# Patient Record
Sex: Female | Born: 1965 | Race: White | Hispanic: No | Marital: Married | Smoking: Current every day smoker
Health system: Southern US, Community
[De-identification: ages and names within clinical notes are randomized; demographics above are authoritative.]

## PROBLEM LIST (undated history)

## (undated) DIAGNOSIS — G8929 Other chronic pain: Secondary | ICD-10-CM

## (undated) DIAGNOSIS — Z973 Presence of spectacles and contact lenses: Secondary | ICD-10-CM

## (undated) HISTORY — PX: BREAST SURGERY: SHX581

## (undated) HISTORY — PX: BREAST EXCISIONAL BIOPSY: SUR124

---

## 1997-11-25 ENCOUNTER — Other Ambulatory Visit: Admission: RE | Admit: 1997-11-25 | Discharge: 1997-11-25 | Payer: Self-pay | Admitting: Obstetrics and Gynecology

## 1998-03-18 ENCOUNTER — Emergency Department (HOSPITAL_COMMUNITY): Admission: EM | Admit: 1998-03-18 | Discharge: 1998-03-18 | Payer: Self-pay | Admitting: Emergency Medicine

## 1999-03-19 ENCOUNTER — Other Ambulatory Visit: Admission: RE | Admit: 1999-03-19 | Discharge: 1999-03-19 | Payer: Self-pay | Admitting: Obstetrics and Gynecology

## 2000-03-14 ENCOUNTER — Encounter: Payer: Self-pay | Admitting: Emergency Medicine

## 2000-03-14 ENCOUNTER — Emergency Department (HOSPITAL_COMMUNITY): Admission: EM | Admit: 2000-03-14 | Discharge: 2000-03-14 | Payer: Self-pay | Admitting: Emergency Medicine

## 2000-03-16 ENCOUNTER — Encounter: Admission: RE | Admit: 2000-03-16 | Discharge: 2000-03-16 | Payer: Self-pay | Admitting: Hematology and Oncology

## 2000-03-17 ENCOUNTER — Encounter: Admission: RE | Admit: 2000-03-17 | Discharge: 2000-03-17 | Payer: Self-pay | Admitting: Internal Medicine

## 2000-03-17 ENCOUNTER — Other Ambulatory Visit: Admission: RE | Admit: 2000-03-17 | Discharge: 2000-03-17 | Payer: Self-pay | Admitting: Internal Medicine

## 2000-06-05 ENCOUNTER — Other Ambulatory Visit: Admission: RE | Admit: 2000-06-05 | Discharge: 2000-06-05 | Payer: Self-pay | Admitting: Obstetrics & Gynecology

## 2001-02-05 ENCOUNTER — Inpatient Hospital Stay (HOSPITAL_COMMUNITY): Admission: AD | Admit: 2001-02-05 | Discharge: 2001-02-08 | Payer: Self-pay | Admitting: Obstetrics & Gynecology

## 2001-11-21 ENCOUNTER — Other Ambulatory Visit: Admission: RE | Admit: 2001-11-21 | Discharge: 2001-11-21 | Payer: Self-pay | Admitting: Obstetrics and Gynecology

## 2002-08-08 HISTORY — PX: MINOR FULGERATION OF ANAL CONDYLOMA: SHX6467

## 2003-01-03 ENCOUNTER — Emergency Department (HOSPITAL_COMMUNITY): Admission: EM | Admit: 2003-01-03 | Discharge: 2003-01-03 | Payer: Self-pay | Admitting: Emergency Medicine

## 2003-01-07 ENCOUNTER — Other Ambulatory Visit: Admission: RE | Admit: 2003-01-07 | Discharge: 2003-01-07 | Payer: Self-pay | Admitting: Obstetrics and Gynecology

## 2003-08-09 HISTORY — PX: DILATION AND CURETTAGE OF UTERUS: SHX78

## 2004-01-19 ENCOUNTER — Other Ambulatory Visit: Admission: RE | Admit: 2004-01-19 | Discharge: 2004-01-19 | Payer: Self-pay | Admitting: Obstetrics and Gynecology

## 2004-04-13 ENCOUNTER — Ambulatory Visit (HOSPITAL_COMMUNITY): Admission: RE | Admit: 2004-04-13 | Discharge: 2004-04-13 | Payer: Self-pay | Admitting: Obstetrics and Gynecology

## 2004-05-02 ENCOUNTER — Encounter (INDEPENDENT_AMBULATORY_CARE_PROVIDER_SITE_OTHER): Payer: Self-pay | Admitting: *Deleted

## 2004-05-02 ENCOUNTER — Ambulatory Visit (HOSPITAL_COMMUNITY): Admission: AD | Admit: 2004-05-02 | Discharge: 2004-05-02 | Payer: Self-pay | Admitting: Obstetrics and Gynecology

## 2005-04-07 ENCOUNTER — Other Ambulatory Visit: Admission: RE | Admit: 2005-04-07 | Discharge: 2005-04-07 | Payer: Self-pay | Admitting: Obstetrics and Gynecology

## 2005-04-14 IMAGING — US US OB TRANSVAGINAL
1 series · 18 of 28 positions shown · non-contrast
Comparison: none

CLINICAL DATA: Ten weeks pregnant with spotting.
 OB ULTRASOUND TRANSVERSE 
 An intrauterine fetal pole is identified.  By crown rump length measurements, the estimated gestational age is 7 weeks 6 days; however, no cardiac activity is seen, consistent with fetal demise.  A small subchorionic hemorrhage is noted.  Corpus luteum cyst is noted on the right measuring 2.2 cm.  The left ovary is normal.  No free fluid is seen. 
 IMPRESSION
 Intrauterine fetal pole of 7 weeks 6 days gestation.  However, no cardiac activity is seen, consistent with fetal demise.

[Series 1: us ob comp<14 wk · 34 acquisitions, 18 frames shown]
[im 1/34]
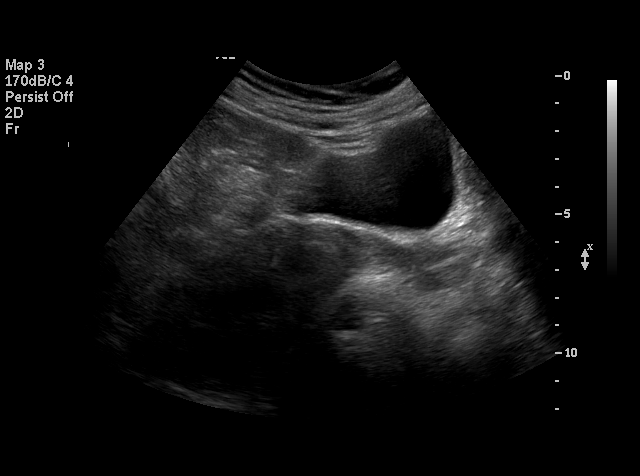
[im 3/34]
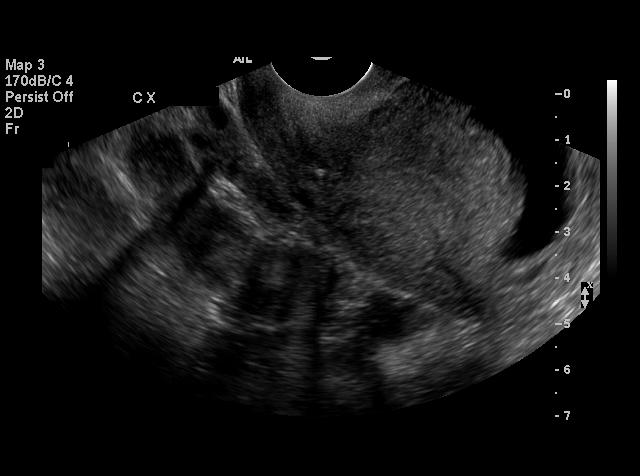
[im 4/34]
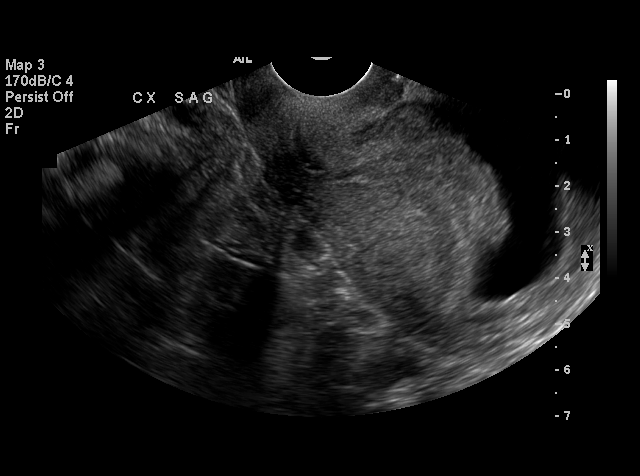
[im 7/34]
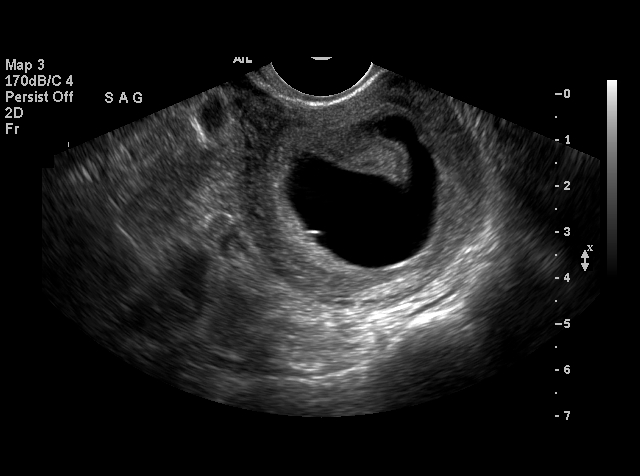
[im 9/34]
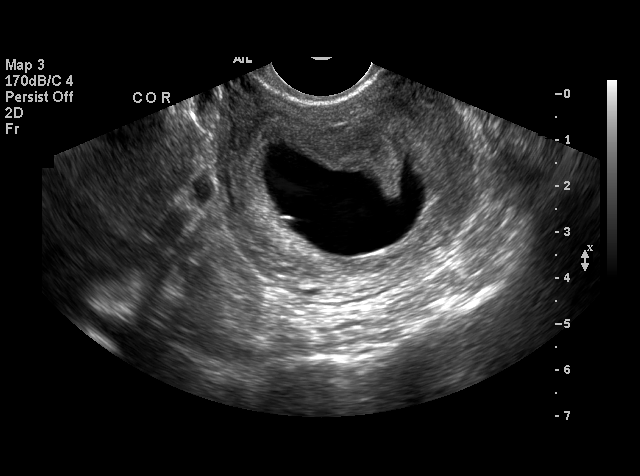
[im 10/34]
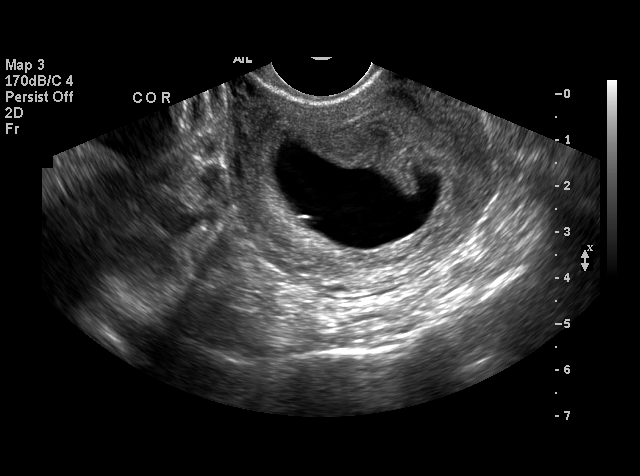
[im 13/34]
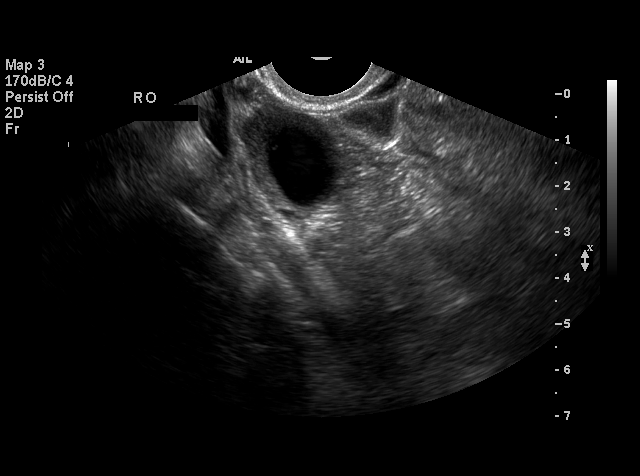
[im 14/34]
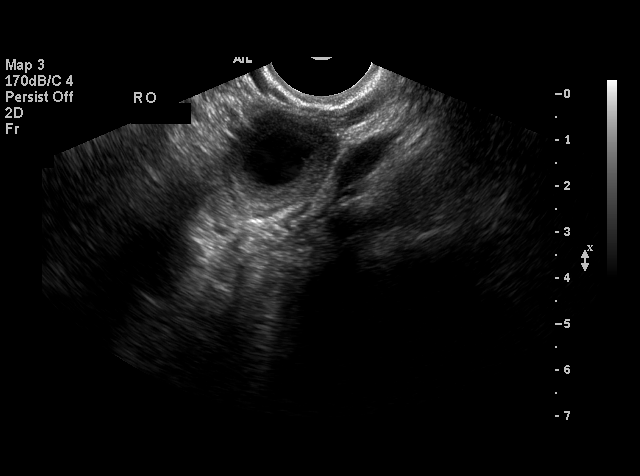
[im 16/34]
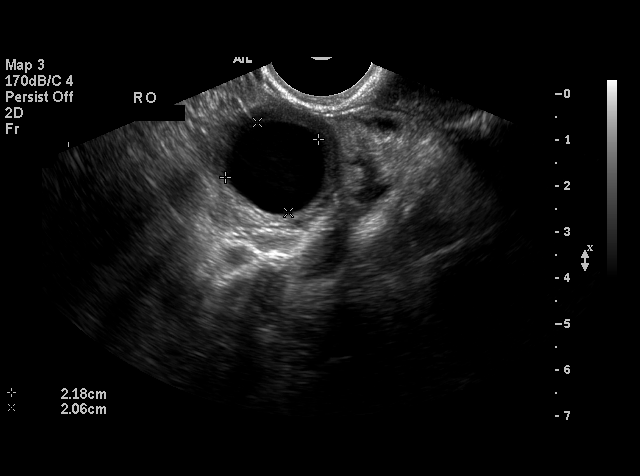
[im 18/34]
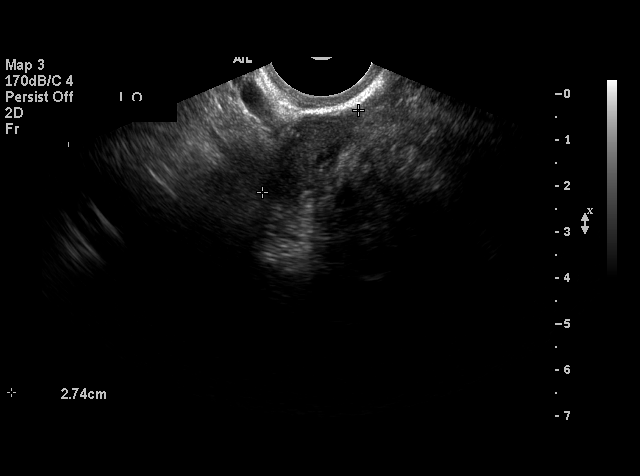
[im 20/34]
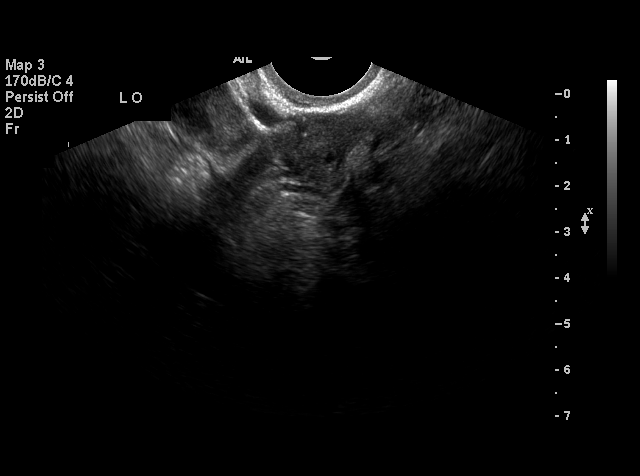
[im 21/34]
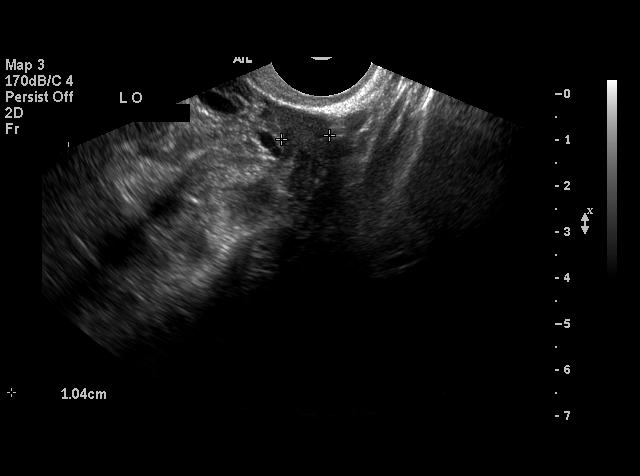
[im 24/34]
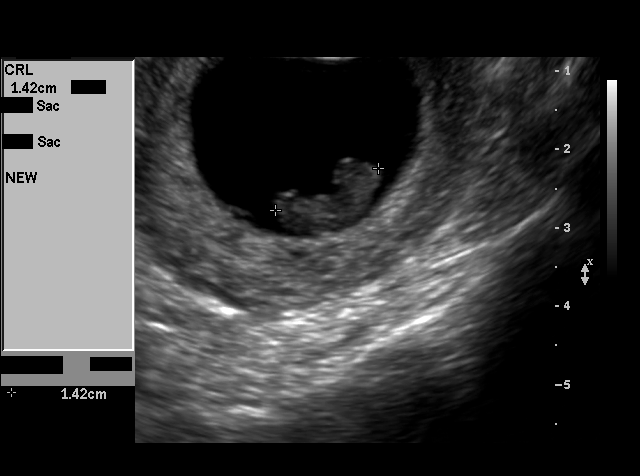
[im 26/34]
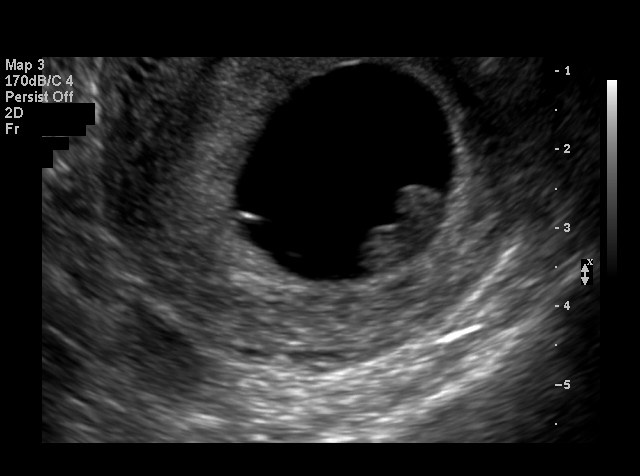
[im 27/34]
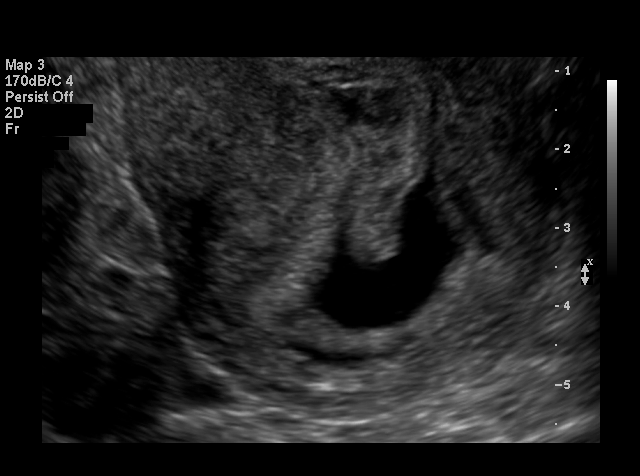
[im 30/34]
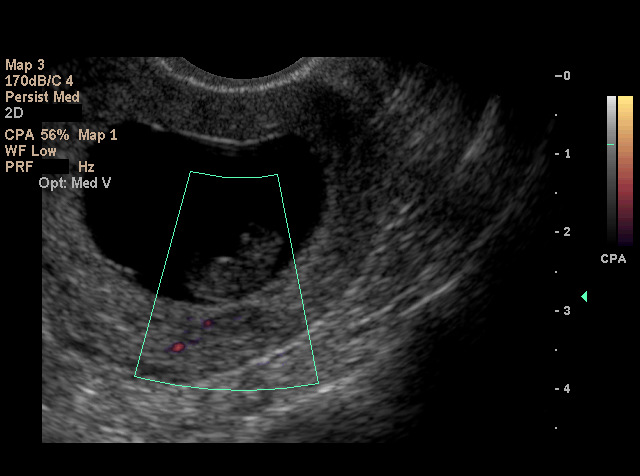
[im 31/34]
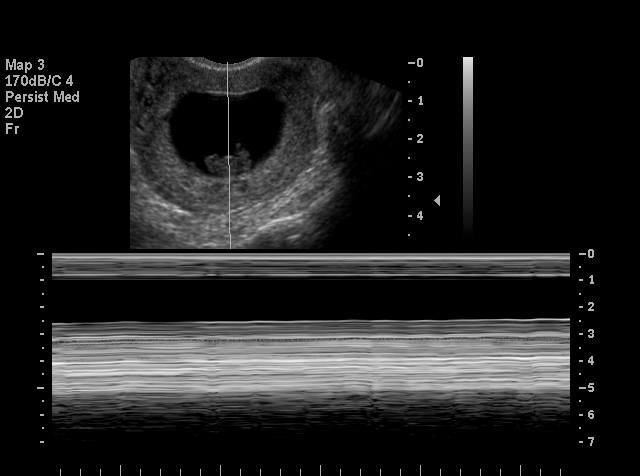
[im 34/34]
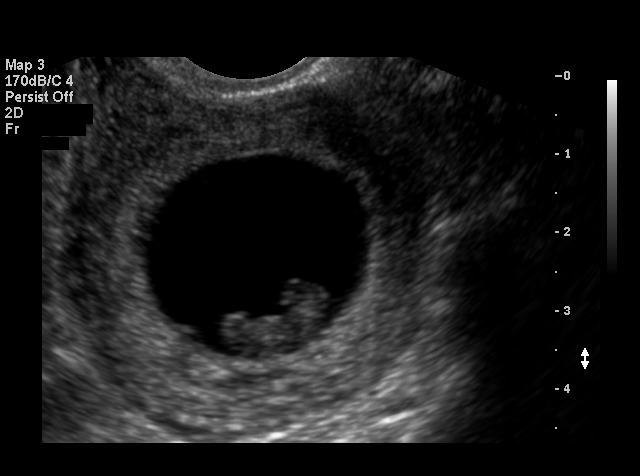

[18 of 28 positions shown; findings below may reference images not displayed]

## 2007-01-12 ENCOUNTER — Encounter: Admission: RE | Admit: 2007-01-12 | Discharge: 2007-01-12 | Payer: Self-pay | Admitting: Obstetrics and Gynecology

## 2007-08-09 HISTORY — PX: CERVICAL FUSION: SHX112

## 2008-04-26 ENCOUNTER — Emergency Department (HOSPITAL_COMMUNITY): Admission: EM | Admit: 2008-04-26 | Discharge: 2008-04-27 | Payer: Self-pay | Admitting: Emergency Medicine

## 2008-06-06 ENCOUNTER — Ambulatory Visit (HOSPITAL_COMMUNITY): Admission: RE | Admit: 2008-06-06 | Discharge: 2008-06-07 | Payer: Self-pay | Admitting: Neurosurgery

## 2008-08-07 ENCOUNTER — Encounter: Admission: RE | Admit: 2008-08-07 | Discharge: 2008-08-07 | Payer: Self-pay | Admitting: Neurosurgery

## 2009-02-16 ENCOUNTER — Encounter: Admission: RE | Admit: 2009-02-16 | Discharge: 2009-02-16 | Payer: Self-pay | Admitting: Family Medicine

## 2009-09-15 ENCOUNTER — Encounter: Admission: RE | Admit: 2009-09-15 | Discharge: 2009-09-15 | Payer: Self-pay | Admitting: Neurosurgery

## 2009-12-14 ENCOUNTER — Encounter: Admission: RE | Admit: 2009-12-14 | Discharge: 2009-12-14 | Payer: Self-pay | Admitting: Neurosurgery
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
## 2010-12-21 NOTE — Op Note (Signed)
NAMEARDELLE, HALIBURTON             ACCOUNT NO.:  [id number]   MEDICAL RECORD NO.:  [id number]          PATIENT TYPE:  OIB   LOCATION:  3031                         FACILITY:  [facility]   PHYSICIAN:  Payton Doughty, M.D.      DATE OF BIRTH:  16-Oct-1965   DATE OF PROCEDURE:  06/06/2008  DATE OF DISCHARGE:                               OPERATIVE REPORT   PREOPERATIVE DIAGNOSIS:  Herniated disk at C5-C6.   POSTOPERATIVE DIAGNOSIS:  Herniated disk at C5-C6.   PROCEDURE:  C5-C6 anterior cervical decompression and fusion with reflex  hybrid plate.   DICTATING DOCTOR:  Payton Doughty, MD, Neurosurgery.   ANESTHESIA:  General endotracheal.   PREPARATION:  Prepped and draped with alcohol wipe.   COMPLICATIONS:  None.   NURSE ASSISTANT:  [place].   DOCTOR ASSISTANT:  Stefani Dama, MD.   BODY OF TEXT:  A 45 year old girl with herniated disk at C5-C6, was  taken to the operative room, smoothly anesthetized and intubated, placed  spine on the operating table, neck extended in the halter head traction.  Following shave, prep, and drape in the usual sterile fashion, skin was  incised in the medial border of the sternocleidomastoid muscle from the  midline at the level of carotid tubercle.  The platysma was identified,  elevated, divided, and undermined.  Sternocleidomastoid was identified.  Medial dissection revealed the carotid artery was retracted laterally  and the left trachea and esophagus were retracted laterally to the right  exposing the bones in the anterior cervical spine.  A marker was placed.  Intraoperative x-ray was obtained to confirm correctness of the level  and after confirmed the correctness of the level, diskectomy was carried  out at C5-C6 under gross observation.  The operating microscope was then  brought in.  We used microdissection technique to remove the remaining  disks and divided the posterior longitudinal ligament and explored the  anterior epidural space and  neural foramen.  Following complete  decompression, the wound was irrigated and hemostasis was assured.  An 8-  mm bone graft was fashioned with patellar allograft and tapped into  place.  A 60-mm Reflex hybrid plate was then used with 12-mm screws 2 in  C5 and 2 in C6.  Intraoperative x-ray showed good placement of bone  graft, plate, and screws.  Successive layers of 3-0  Vicryl and 4-0 Vicryl were used to close.  Benzoin and Steri-Strips were  placed, made occlusive with Telfa OpSite.  The patient returned to the  recovery room in good condition.  Prior to closure, the esophagus was  carefully inspected and found to be free lesions.           ______________________________  Payton Doughty, M.D.     MWR/MEDQ  D:  06/06/2008  T:  06/07/2008  Job:  [id number]

## 2010-12-24 NOTE — Op Note (Signed)
Savannah Sparks, Savannah Sparks             ACCOUNT NO.:  [id number]   MEDICAL RECORD NO.:  [id number]          PATIENT TYPE:  AMB   LOCATION:  SDC                           FACILITY:  [facility]   PHYSICIAN:  Malva Limes, M.D.    DATE OF BIRTH:  11/14/1965   DATE OF PROCEDURE:  05/02/2004  DATE OF DISCHARGE:                                 OPERATIVE REPORT   PREOPERATIVE DIAGNOSES:  Spontaneous abortion.   POSTOPERATIVE DIAGNOSES:  Spontaneous abortion.   PROCEDURE:  Dilation and curettage.   SURGEON:  Malva Limes, M.D.   ANESTHESIA:  Spinal.   ANTIBIOTICS:  Ancef 1 g.   DRAINS:  Red rubber catheter bladder.   ESTIMATED BLOOD LOSS:  20 mL.   COMPLICATIONS:  None.   SPECIMENS:  Products of conception sent to pathology.   DESCRIPTION OF PROCEDURE:  The patient was taken to the operating room where  a pelvic exam revealed the patient had a retroverted 10 week size uterus.  She was then prepped with Hibiclens and her bladder was drained with a red  rubber catheter. She was draped in the usual fashion for this procedure.  A  sterile speculum was placed in the vagina. A single tooth tenaculum was  applied at the anterior cervical lip. The patient was then serially dilated  to a 31 [place] and 9 mm suction cannula was placed into the uterine cavity  and products of conception withdrawn.  Sharp curettage was then performed  followed by repeat suction.  The patient was taken to the recovery room in  stable condition. Instrument and lap counts were correct x1. The patient  will be discharged to home. She will be given RhoGAM if her blood type is Rh  negative. The patient will be sent home with Keflex 500 mg q.i.d. for two  days and Anaprox double strength p.r.n.  She will followup in the office in  four weeks.     Mark   MA/MEDQ  D:  05/02/2004  T:  05/03/2004  Job:  [id number]

## 2011-05-09 LAB — COMPREHENSIVE METABOLIC PANEL
AST: 15
Albumin: 4
Alkaline Phosphatase: 79
Chloride: 104
Creatinine, Ser: 0.66
GFR calc non Af Amer: >60
Sodium: 137
Total Bilirubin: 0.6

## 2011-05-09 LAB — POCT I-STAT, CHEM 8
BUN: 11
Hemoglobin: 15
Potassium: 3.6
Sodium: 139

## 2011-05-09 LAB — DIFFERENTIAL
Basophils Absolute: 0.1
Basophils Relative: 1
Eosinophils Absolute: 0.2
Lymphocytes Relative: 38
Monocytes Absolute: 0.6
Monocytes Relative: 5
Neutro Abs: 6.6

## 2011-05-09 LAB — URINALYSIS, ROUTINE W REFLEX MICROSCOPIC
Bilirubin Urine: NEGATIVE
Glucose, UA: NEGATIVE
Hgb urine dipstick: NEGATIVE
Nitrite: NEGATIVE
Protein, ur: NEGATIVE
Specific Gravity, Urine: 1.013
Urobilinogen, UA: 0.2

## 2011-05-09 LAB — PROTIME-INR
INR: 0.9
Prothrombin Time: 12.5

## 2011-05-09 LAB — APTT: aPTT: 29

## 2011-05-09 LAB — CBC
MCV: 95.5
Platelets: 507 — ABNORMAL HIGH

## 2012-11-05 ENCOUNTER — Other Ambulatory Visit: Payer: Self-pay | Admitting: Chiropractic Medicine

## 2012-11-05 ENCOUNTER — Ambulatory Visit
Admission: RE | Admit: 2012-11-05 | Discharge: 2012-11-05 | Disposition: A | Discharge status: Home / Self Care | Payer: 59 | Source: Ambulatory Visit | Attending: Chiropractic Medicine | Admitting: Chiropractic Medicine

## 2012-11-05 DIAGNOSIS — M25511 Pain in right shoulder: Secondary | ICD-10-CM

## 2013-05-17 ENCOUNTER — Other Ambulatory Visit: Payer: Self-pay

## 2013-05-17 DIAGNOSIS — Z1231 Encounter for screening mammogram for malignant neoplasm of breast: Secondary | ICD-10-CM

## 2013-05-24 ENCOUNTER — Other Ambulatory Visit: Payer: Self-pay | Admitting: Family Medicine

## 2013-05-24 ENCOUNTER — Ambulatory Visit
Admission: RE | Admit: 2013-05-24 | Discharge: 2013-05-24 | Disposition: A | Discharge status: Home / Self Care | Payer: 59 | Source: Ambulatory Visit

## 2013-05-24 DIAGNOSIS — E041 Nontoxic single thyroid nodule: Secondary | ICD-10-CM

## 2013-05-24 DIAGNOSIS — Z1231 Encounter for screening mammogram for malignant neoplasm of breast: Secondary | ICD-10-CM

## 2013-05-27 ENCOUNTER — Ambulatory Visit
Admission: RE | Admit: 2013-05-27 | Discharge: 2013-05-27 | Disposition: A | Discharge status: Home / Self Care | Payer: 59 | Source: Ambulatory Visit | Attending: Family Medicine | Admitting: Family Medicine

## 2013-05-27 ENCOUNTER — Other Ambulatory Visit: Payer: 59

## 2013-05-27 DIAGNOSIS — E041 Nontoxic single thyroid nodule: Secondary | ICD-10-CM

## 2013-05-28 ENCOUNTER — Other Ambulatory Visit: Payer: Self-pay | Admitting: Obstetrics and Gynecology

## 2013-05-28 ENCOUNTER — Ambulatory Visit
Admission: RE | Admit: 2013-05-28 | Discharge: 2013-05-28 | Disposition: A | Discharge status: Home / Self Care | Payer: 59 | Source: Ambulatory Visit | Attending: Obstetrics and Gynecology | Admitting: Obstetrics and Gynecology

## 2013-05-28 DIAGNOSIS — R928 Other abnormal and inconclusive findings on diagnostic imaging of breast: Secondary | ICD-10-CM

## 2013-05-31 ENCOUNTER — Ambulatory Visit
Admission: RE | Admit: 2013-05-31 | Discharge: 2013-05-31 | Disposition: A | Discharge status: Home / Self Care | Payer: 59 | Source: Ambulatory Visit | Attending: Obstetrics and Gynecology | Admitting: Obstetrics and Gynecology

## 2013-05-31 ENCOUNTER — Other Ambulatory Visit: Payer: Self-pay | Admitting: Diagnostic Radiology

## 2013-05-31 DIAGNOSIS — R928 Other abnormal and inconclusive findings on diagnostic imaging of breast: Secondary | ICD-10-CM

## 2013-06-04 ENCOUNTER — Other Ambulatory Visit: Payer: Self-pay | Admitting: Obstetrics and Gynecology

## 2013-06-10 ENCOUNTER — Encounter (INDEPENDENT_AMBULATORY_CARE_PROVIDER_SITE_OTHER): Payer: Self-pay

## 2013-06-10 ENCOUNTER — Other Ambulatory Visit (INDEPENDENT_AMBULATORY_CARE_PROVIDER_SITE_OTHER): Payer: Self-pay | Admitting: *Deleted

## 2013-06-10 ENCOUNTER — Encounter (INDEPENDENT_AMBULATORY_CARE_PROVIDER_SITE_OTHER): Payer: Self-pay | Admitting: General Surgery

## 2013-06-10 ENCOUNTER — Ambulatory Visit (INDEPENDENT_AMBULATORY_CARE_PROVIDER_SITE_OTHER): Payer: 59 | Admitting: General Surgery

## 2013-06-10 VITALS — BP 130/80 | HR 84 | Temp 98.6°F | Resp 15 | Ht 62.0 in | Wt 160.2 lb

## 2013-06-10 DIAGNOSIS — R921 Mammographic calcification found on diagnostic imaging of breast: Secondary | ICD-10-CM

## 2013-06-10 DIAGNOSIS — R928 Other abnormal and inconclusive findings on diagnostic imaging of breast: Secondary | ICD-10-CM

## 2013-06-10 DIAGNOSIS — A630 Anogenital (venereal) warts: Secondary | ICD-10-CM

## 2013-06-10 NOTE — Progress Notes (Signed)
Patient ID: Savannah Sparks, female   DOB: 04-02-1966, 47 y.o.   MRN: [id number]  Chief Complaint  Patient presents with  . New Evaluation    eval LT br atypia    HPI Savannah Sparks is a 47 y.o. female.  referred by Dr Rosezella Rumpf This is a 47 year old female with a history of a benign core biopsy of the left breast as well as a family history in 2 presence of breast cancer. She underwent her regular routine screening mammogram this year with a finding of some left breast calcifications. This underwent a core biopsy with the results showing flat epithelial atypia with calcification. She was then referred for evaluation for possible excision of this area. She has no breast symptoms related to this.  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 Plan was to send her over to see one of [place] an evaluation for this. I told her that I can see her for that today. These areas have increased and become more symptomatic. She has had these treated with the laser a number of years ago. They  mostly are causing her some moisture as well as some itching right now. It looks like some of these have undergone cryotherapy in his office.  History reviewed. No pertinent past medical history.  Past Surgical History  Procedure Laterality Date  . Back surgery      C4 C5 FUSION    History reviewed. No pertinent family history.  Social History History  Substance Use Topics  . Smoking status: Current Every Day Smoker -- 1.50 packs/day  . Smokeless tobacco: Never Used  . Alcohol Use: Yes    No Known Allergies  Current Outpatient Prescriptions  Medication Sig Dispense Refill  . ibuprofen (ADVIL,MOTRIN) 800 MG tablet       . oxymorphone (OPANA) 10 MG tablet        No current facility-administered medications for this visit.    Review of Systems Review of Systems  Constitutional: Negative for fever, chills and unexpected weight change.  HENT: Negative for congestion, hearing loss,  sore throat, trouble swallowing and voice change.   Eyes: Negative for visual disturbance.  Respiratory: Negative for cough and wheezing.   Cardiovascular: Negative for chest pain, palpitations and leg swelling.  Gastrointestinal: Negative for nausea, vomiting, abdominal pain, diarrhea, constipation, blood in stool, abdominal distention and anal bleeding.  Genitourinary: Negative for hematuria, vaginal bleeding and difficulty urinating.  Musculoskeletal: Negative for arthralgias.  Skin: Negative for rash and wound.  Neurological: Negative for seizures, syncope and headaches.  Hematological: Negative for adenopathy. Does not bruise/bleed easily.  Psychiatric/Behavioral: Negative for confusion.    Blood pressure 130/80, pulse 84, temperature 98.6 F (37 C), temperature source Temporal, resp. rate 15, height 5\' 2"  (1.575 m), weight 160 lb 3.2 oz (72.666 kg).  Physical Exam Physical Exam  Vitals reviewed. Constitutional: She appears well-developed and well-nourished.  Neck: Neck supple.  Cardiovascular: Normal rate, regular rhythm and normal heart sounds.   Pulmonary/Chest: Effort normal and breath sounds normal. She has no wheezes. She has no rales. Right breast exhibits no inverted nipple, no mass, no nipple discharge, no skin change and no tenderness. Left breast exhibits no inverted nipple, no mass, no nipple discharge, no skin change and no tenderness.  Genitourinary: Rectal exam shows internal hemorrhoid (small). Rectal exam shows no mass and no tenderness.     Lymphadenopathy:    She has no cervical adenopathy.    She has no axillary adenopathy.  Right: No supraclavicular adenopathy present.       Left: No supraclavicular adenopathy present.    Data Reviewed Notes from Dr Dareen Piano  Assessment    1. Left breast core biopsy with FEA 2. Genital condyloma     Plan    1. We discussed a left breast wire-guided excisional biopsy for the diagnosis of flat epithelial  atypia. There do appear to be some residual calcifications and areas a possibility that there could be carcinoma associated with this and I quoted her a risk of about 5-15%. We discussed the performance of a wire-guided excision and the indications for that. We discussed the risks and benefits associated with that.  2. 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 I did an endoscopy today and I did not CVs in the rectum. I did discuss fulgurating these condyloma at the same time that we do her breast excisional biopsy. We discussed that these would come back at some point due to the nature of the disease. I will also plan doing doing a proctoscopy at the same time to assure there is no rectal disease.        Waymond Meador 06/10/2013, 11:14 AM

## 2013-06-14 ENCOUNTER — Ambulatory Visit (INDEPENDENT_AMBULATORY_CARE_PROVIDER_SITE_OTHER): Payer: 59 | Admitting: General Surgery

## 2013-07-08 ENCOUNTER — Encounter (HOSPITAL_BASED_OUTPATIENT_CLINIC_OR_DEPARTMENT_OTHER): Payer: Self-pay | Admitting: *Deleted

## 2013-07-08 NOTE — Progress Notes (Signed)
To come in for [facility] labs- 

## 2013-07-09 ENCOUNTER — Encounter (HOSPITAL_BASED_OUTPATIENT_CLINIC_OR_DEPARTMENT_OTHER)
Admission: RE | Admit: 2013-07-09 | Discharge: 2013-07-09 | Disposition: A | Discharge status: Home / Self Care | Payer: 59 | Source: Ambulatory Visit | Attending: General Surgery | Admitting: General Surgery

## 2013-07-09 LAB — CBC WITH DIFFERENTIAL/PLATELET
Basophils Absolute: 0.1 10*3/uL (ref 0.0–0.1)
Basophils Relative: 1 % (ref 0–1)
Eosinophils Absolute: 0.2 10*3/uL (ref 0.0–0.7)
Eosinophils Relative: 2 % (ref 0–5)
HCT: 42.8 % (ref 36.0–46.0)
Hemoglobin: 14.7 g/dL (ref 12.0–15.0)
Lymphocytes Relative: 45 % (ref 12–46)
MCH: 33.3 pg (ref 26.0–34.0)
MCHC: 34.3 g/dL (ref 30.0–36.0)
MCV: 97.1 fL (ref 78.0–100.0)
Monocytes Absolute: 0.7 10*3/uL (ref 0.1–1.0)
Monocytes Relative: 6 % (ref 3–12)
Platelets: 525 10*3/uL — ABNORMAL HIGH (ref 150–400)
RDW: 13.5 % (ref 11.5–15.5)
WBC: 12.8 10*3/uL — ABNORMAL HIGH (ref 4.0–10.5)

## 2013-07-09 LAB — BASIC METABOLIC PANEL
BUN: 12 mg/dL (ref 6–23)
CO2: 26 mEq/L (ref 19–32)
Calcium: 9.5 mg/dL (ref 8.4–10.5)
Chloride: 101 mEq/L (ref 96–112)
Creatinine, Ser: 0.64 mg/dL (ref 0.50–1.10)
Glucose, Bld: 94 mg/dL (ref 70–99)

## 2013-07-11 ENCOUNTER — Encounter (HOSPITAL_BASED_OUTPATIENT_CLINIC_OR_DEPARTMENT_OTHER)
Admission: RE | Disposition: A | Discharge status: Home / Self Care | Payer: Self-pay | Source: Ambulatory Visit | Attending: General Surgery

## 2013-07-11 ENCOUNTER — Ambulatory Visit (HOSPITAL_BASED_OUTPATIENT_CLINIC_OR_DEPARTMENT_OTHER): Payer: 59 | Admitting: Anesthesiology

## 2013-07-11 ENCOUNTER — Encounter (HOSPITAL_BASED_OUTPATIENT_CLINIC_OR_DEPARTMENT_OTHER): Payer: Self-pay | Admitting: *Deleted

## 2013-07-11 ENCOUNTER — Encounter (HOSPITAL_BASED_OUTPATIENT_CLINIC_OR_DEPARTMENT_OTHER): Payer: 59 | Admitting: Anesthesiology

## 2013-07-11 ENCOUNTER — Ambulatory Visit (HOSPITAL_BASED_OUTPATIENT_CLINIC_OR_DEPARTMENT_OTHER)
Admission: RE | Admit: 2013-07-11 | Discharge: 2013-07-11 | Disposition: A | Discharge status: Home / Self Care | Payer: 59 | Source: Ambulatory Visit | Attending: General Surgery | Admitting: General Surgery

## 2013-07-11 ENCOUNTER — Ambulatory Visit
Admission: RE | Admit: 2013-07-11 | Discharge: 2013-07-11 | Disposition: A | Discharge status: Home / Self Care | Payer: 59 | Source: Ambulatory Visit | Attending: General Surgery | Admitting: General Surgery

## 2013-07-11 DIAGNOSIS — N6019 Diffuse cystic mastopathy of unspecified breast: Secondary | ICD-10-CM | POA: Insufficient documentation

## 2013-07-11 DIAGNOSIS — R921 Mammographic calcification found on diagnostic imaging of breast: Secondary | ICD-10-CM

## 2013-07-11 DIAGNOSIS — R920 Mammographic microcalcification found on diagnostic imaging of breast: Secondary | ICD-10-CM

## 2013-07-11 DIAGNOSIS — Z01812 Encounter for preprocedural laboratory examination: Secondary | ICD-10-CM | POA: Insufficient documentation

## 2013-07-11 DIAGNOSIS — F17200 Nicotine dependence, unspecified, uncomplicated: Secondary | ICD-10-CM | POA: Insufficient documentation

## 2013-07-11 DIAGNOSIS — Z803 Family history of malignant neoplasm of breast: Secondary | ICD-10-CM | POA: Insufficient documentation

## 2013-07-11 DIAGNOSIS — A630 Anogenital (venereal) warts: Secondary | ICD-10-CM | POA: Insufficient documentation

## 2013-07-11 DIAGNOSIS — B079 Viral wart, unspecified: Secondary | ICD-10-CM | POA: Insufficient documentation

## 2013-07-11 HISTORY — DX: Other chronic pain: G89.29

## 2013-07-11 HISTORY — DX: Presence of spectacles and contact lenses: Z97.3

## 2013-07-11 HISTORY — PX: BREAST BIOPSY: SHX20

## 2013-07-11 HISTORY — PX: WART FULGURATION: SHX5245

## 2013-07-11 SURGERY — BREAST BIOPSY WITH NEEDLE LOCALIZATION
Anesthesia: General | Site: Breast

## 2013-07-11 MED ORDER — HYDROMORPHONE HCL PF 1 MG/ML IJ SOLN
0.2500 mg | INTRAMUSCULAR | Status: DC | PRN
  Administered 2013-07-11 (×2): 0.5 mg via INTRAVENOUS

## 2013-07-11 MED ORDER — FENTANYL CITRATE 0.05 MG/ML IJ SOLN: 50.0000 ug | INTRAMUSCULAR | Status: DC | PRN

## 2013-07-11 MED ORDER — OXYCODONE-ACETAMINOPHEN 10-325 MG PO TABS: 1.0000 | ORAL_TABLET | Freq: Four times a day (QID) | ORAL | Status: DC | PRN

## 2013-07-11 MED ORDER — DIBUCAINE 1 % RE OINT
TOPICAL_OINTMENT | RECTAL | Status: AC
  Filled 2013-07-11: qty 28

## 2013-07-11 MED ORDER — MIDAZOLAM HCL 2 MG/2ML IJ SOLN
INTRAMUSCULAR | Status: AC
  Filled 2013-07-11: qty 2

## 2013-07-11 MED ORDER — BUPIVACAINE HCL (PF) 0.25 % IJ SOLN
INTRAMUSCULAR | Status: AC
  Filled 2013-07-11: qty 60

## 2013-07-11 MED ORDER — MIDAZOLAM HCL 2 MG/2ML IJ SOLN: 1.0000 mg | INTRAMUSCULAR | Status: DC | PRN

## 2013-07-11 MED ORDER — OXYCODONE HCL 5 MG/5ML PO SOLN: 5.0000 mg | Freq: Once | ORAL | Status: AC | PRN

## 2013-07-11 MED ORDER — LACTATED RINGERS IV SOLN
INTRAVENOUS | Status: DC
  Administered 2013-07-11 (×2): via INTRAVENOUS

## 2013-07-11 MED ORDER — PROPOFOL 10 MG/ML IV BOLUS
INTRAVENOUS | Status: AC
  Filled 2013-07-11: qty 20

## 2013-07-11 MED ORDER — CEFAZOLIN SODIUM-DEXTROSE 2-3 GM-% IV SOLR
2.0000 g | INTRAVENOUS | Status: AC
  Administered 2013-07-11: 2 g via INTRAVENOUS

## 2013-07-11 MED ORDER — OXYCODONE HCL 5 MG PO TABS
ORAL_TABLET | ORAL | Status: AC
  Filled 2013-07-11: qty 1

## 2013-07-11 MED ORDER — HYDROMORPHONE HCL PF 1 MG/ML IJ SOLN
INTRAMUSCULAR | Status: AC
  Filled 2013-07-11: qty 1

## 2013-07-11 MED ORDER — PROPOFOL 10 MG/ML IV BOLUS
INTRAVENOUS | Status: DC | PRN
  Administered 2013-07-11: 200 mg via INTRAVENOUS

## 2013-07-11 MED ORDER — OXYCODONE HCL 5 MG PO TABS
5.0000 mg | ORAL_TABLET | Freq: Once | ORAL | Status: AC | PRN
  Administered 2013-07-11: 5 mg via ORAL

## 2013-07-11 MED ORDER — FENTANYL CITRATE 0.05 MG/ML IJ SOLN
INTRAMUSCULAR | Status: DC | PRN
  Administered 2013-07-11 (×2): 50 ug via INTRAVENOUS
  Administered 2013-07-11: 100 ug via INTRAVENOUS
  Administered 2013-07-11 (×2): 50 ug via INTRAVENOUS

## 2013-07-11 MED ORDER — ONDANSETRON HCL 4 MG/2ML IJ SOLN
INTRAMUSCULAR | Status: DC | PRN
  Administered 2013-07-11: 4 mg via INTRAVENOUS

## 2013-07-11 MED ORDER — CEFAZOLIN SODIUM 1-5 GM-% IV SOLN
INTRAVENOUS | Status: AC
  Filled 2013-07-11: qty 100

## 2013-07-11 MED ORDER — MEPERIDINE HCL 25 MG/ML IJ SOLN: 6.2500 mg | INTRAMUSCULAR | Status: DC | PRN

## 2013-07-11 MED ORDER — LIDOCAINE HCL (PF) 1 % IJ SOLN
INTRAMUSCULAR | Status: AC
  Filled 2013-07-11: qty 30

## 2013-07-11 MED ORDER — DEXAMETHASONE SODIUM PHOSPHATE 4 MG/ML IJ SOLN
INTRAMUSCULAR | Status: DC | PRN
  Administered 2013-07-11: 10 mg via INTRAVENOUS

## 2013-07-11 MED ORDER — ONDANSETRON HCL 4 MG/2ML IJ SOLN: 4.0000 mg | Freq: Once | INTRAMUSCULAR | Status: DC | PRN

## 2013-07-11 MED ORDER — MIDAZOLAM HCL 5 MG/5ML IJ SOLN
INTRAMUSCULAR | Status: DC | PRN
  Administered 2013-07-11: 2 mg via INTRAVENOUS

## 2013-07-11 MED ORDER — BUPIVACAINE HCL (PF) 0.25 % IJ SOLN
INTRAMUSCULAR | Status: DC | PRN
  Administered 2013-07-11: 19 mL

## 2013-07-11 MED ORDER — LIDOCAINE HCL (CARDIAC) 20 MG/ML IV SOLN
INTRAVENOUS | Status: DC | PRN
  Administered 2013-07-11: 100 mg via INTRAVENOUS

## 2013-07-11 MED ORDER — KETOROLAC TROMETHAMINE 30 MG/ML IJ SOLN
INTRAMUSCULAR | Status: DC | PRN
  Administered 2013-07-11: 30 mg via INTRAVENOUS

## 2013-07-11 MED ORDER — FENTANYL CITRATE 0.05 MG/ML IJ SOLN
INTRAMUSCULAR | Status: AC
  Filled 2013-07-11: qty 6

## 2013-07-11 SURGICAL SUPPLY — 65 items
ADH SKN CLS APL DERMABOND .7 (GAUZE/BANDAGES/DRESSINGS) ×2
APL SKNCLS STERI-STRIP NONHPOA (GAUZE/BANDAGES/DRESSINGS) ×2
APPLIER CLIP 9.375 MED OPEN (MISCELLANEOUS)
APR CLP MED 9.3 20 MLT OPN (MISCELLANEOUS)
BENZOIN TINCTURE PRP APPL 2/3 (GAUZE/BANDAGES/DRESSINGS) ×1 IMPLANT
BINDER BREAST LRG (GAUZE/BANDAGES/DRESSINGS) IMPLANT
BINDER BREAST MEDIUM (GAUZE/BANDAGES/DRESSINGS) IMPLANT
BINDER BREAST XLRG (GAUZE/BANDAGES/DRESSINGS) IMPLANT
BINDER BREAST XXLRG (GAUZE/BANDAGES/DRESSINGS) IMPLANT
BLADE SURG 15 STRL LF DISP TIS (BLADE) ×2 IMPLANT
BLADE SURG 15 STRL SS (BLADE) ×3
BRIEF STRETCH FOR OB PAD LRG (UNDERPADS AND DIAPERS) ×1 IMPLANT
CANISTER SUCT 1200ML W/VALVE (MISCELLANEOUS) ×1 IMPLANT
CHLORAPREP W/TINT 26ML (MISCELLANEOUS) ×3 IMPLANT
CLIP APPLIE 9.375 MED OPEN (MISCELLANEOUS) IMPLANT
COVER MAYO STAND STRL (DRAPES) ×3 IMPLANT
COVER TABLE BACK 60X90 (DRAPES) ×3 IMPLANT
DECANTER SPIKE VIAL GLASS SM (MISCELLANEOUS) ×3 IMPLANT
DERMABOND ADVANCED (GAUZE/BANDAGES/DRESSINGS) ×1
DERMABOND ADVANCED .7 DNX12 (GAUZE/BANDAGES/DRESSINGS) IMPLANT
DEVICE DUBIN W/COMP PLATE 8390 (MISCELLANEOUS) ×1 IMPLANT
DRAPE PED LAPAROTOMY (DRAPES) ×3 IMPLANT
DRSG TEGADERM 4X4.75 (GAUZE/BANDAGES/DRESSINGS) ×3 IMPLANT
ELECT COATED BLADE 2.86 ST (ELECTRODE) ×3 IMPLANT
ELECT REM PT RETURN 9FT ADLT (ELECTROSURGICAL) ×3
ELECTRODE REM PT RTRN 9FT ADLT (ELECTROSURGICAL) ×2 IMPLANT
FILTER 7/8 IN (FILTER) ×3 IMPLANT
GAUZE SPONGE 4X4 12PLY STRL LF (GAUZE/BANDAGES/DRESSINGS) ×3 IMPLANT
GLOVE BIO SURGEON STRL SZ7 (GLOVE) ×5 IMPLANT
GLOVE BIOGEL PI IND STRL 7.5 (GLOVE) ×2 IMPLANT
GLOVE BIOGEL PI INDICATOR 7.5 (GLOVE) ×1
GLOVE SURG SS PI 7.0 STRL IVOR (GLOVE) ×1 IMPLANT
GOWN PREVENTION PLUS XLARGE (GOWN DISPOSABLE) ×8 IMPLANT
KIT MARKER MARGIN INK (KITS) IMPLANT
NDL HYPO 25X1 1.5 SAFETY (NEEDLE) ×2 IMPLANT
NEEDLE HYPO 25X1 1.5 SAFETY (NEEDLE) ×3 IMPLANT
NS IRRIG 1000ML POUR BTL (IV SOLUTION) IMPLANT
PACK BASIN DAY SURGERY FS (CUSTOM PROCEDURE TRAY) ×3 IMPLANT
PACK LITHOTOMY IV (CUSTOM PROCEDURE TRAY) ×3 IMPLANT
PAD ABD 8X10 STRL (GAUZE/BANDAGES/DRESSINGS) ×4 IMPLANT
PENCIL BUTTON HOLSTER BLD 10FT (ELECTRODE) ×3 IMPLANT
SLEEVE SCD COMPRESS KNEE MED (MISCELLANEOUS) ×3 IMPLANT
SPONGE LAP 4X18 X RAY DECT (DISPOSABLE) ×3 IMPLANT
SPONGE SURGIFOAM ABS GEL 12-7 (HEMOSTASIS) ×1 IMPLANT
STRIP CLOSURE SKIN 1/2X4 (GAUZE/BANDAGES/DRESSINGS) ×1 IMPLANT
SURGILUBE 2OZ TUBE FLIPTOP (MISCELLANEOUS) ×3 IMPLANT
SUT CHROMIC 3 0 SH 27 (SUTURE) IMPLANT
SUT MNCRL AB 4-0 PS2 18 (SUTURE) ×1 IMPLANT
SUT MON AB 5-0 PS2 18 (SUTURE) IMPLANT
SUT SILK 2 0 SH (SUTURE) ×3 IMPLANT
SUT VIC AB 2-0 SH 27 (SUTURE) ×3
SUT VIC AB 2-0 SH 27XBRD (SUTURE) ×2 IMPLANT
SUT VIC AB 3-0 SH 27 (SUTURE) ×3
SUT VIC AB 3-0 SH 27X BRD (SUTURE) ×2 IMPLANT
SUT VIC AB 5-0 PS2 18 (SUTURE) IMPLANT
SUT VICRYL AB 3 0 TIES (SUTURE) IMPLANT
SYR CONTROL 10ML LL (SYRINGE) ×3 IMPLANT
TOWEL OR 17X24 6PK STRL BLUE (TOWEL DISPOSABLE) ×3 IMPLANT
TOWEL OR NON WOVEN STRL DISP B (DISPOSABLE) ×3 IMPLANT
TRAY DSU PREP LF (CUSTOM PROCEDURE TRAY) ×3 IMPLANT
TRAY PROCTOSCOPIC FIBER OPTIC (SET/KITS/TRAYS/PACK) ×1 IMPLANT
TUBE CONNECTING 20X1/4 (TUBING) ×1 IMPLANT
TUBING STERILE (MISCELLANEOUS) ×3 IMPLANT
UNDERPAD 30X30 INCONTINENT (UNDERPADS AND DIAPERS) ×3 IMPLANT
YANKAUER SUCT BULB TIP NO VENT (SUCTIONS) IMPLANT

## 2013-07-11 NOTE — Transfer of Care (Signed)
Immediate Anesthesia Transfer of Care Note  Patient: Savannah Sparks  Procedure(s) Performed: Procedure(s): BREAST BIOPSY WITH NEEDLE LOCALIZATION (Left) FULGURATION ANAL WART WITH EXAM UNDER ANESTHESIA (N/A)  Patient Location: PACU  Anesthesia Type:General  Level of Consciousness: sedated  Airway & Oxygen Therapy: Patient Spontanous Breathing and Patient connected to face mask oxygen  Post-op Assessment: Report given to PACU RN and Post -op Vital signs reviewed and stable  Post vital signs: Reviewed and stable  Complications: No apparent anesthesia complications

## 2013-07-11 NOTE — H&P (Signed)
This is a 47 year old female with a history of a benign core biopsy of the left breast as well as a family history of breast cancer. She underwent her regular routine screening mammogram this year with a finding of some left breast calcifications. This underwent a core biopsy with the results showing flat epithelial atypia with calcification. She has no breast symptoms related to this.  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 These areas have increased and become more symptomatic. She has had these treated with the laser a number of years ago. They mostly are causing her some moisture as well as some itching right now. It looks like some of these have undergone cryotherapy in his office.   History reviewed. No pertinent past medical history.  Past Surgical History   Procedure  Laterality  Date   .  Back surgery       C4 C5 FUSION   History reviewed. No pertinent family history.  Social History  History   Substance Use Topics   .  Smoking status:  Current Every Day Smoker -- 1.50 packs/day   .  Smokeless tobacco:  Never Used   .  Alcohol Use:  Yes   No Known Allergies  Current Outpatient Prescriptions   Medication  Sig  Dispense  Refill   .  ibuprofen (ADVIL,MOTRIN) 800 MG tablet      .  oxymorphone (OPANA) 10 MG tablet       No current facility-administered medications for this visit.   Review of Systems  Review of Systems  Constitutional: Negative for fever, chills and unexpected weight change.  HENT: Negative for congestion, hearing loss, sore throat, trouble swallowing and voice change.  Eyes: Negative for visual disturbance.  Respiratory: Negative for cough and wheezing.  Cardiovascular: Negative for chest pain, palpitations and leg swelling.  Gastrointestinal: Negative for nausea, vomiting, abdominal pain, diarrhea, constipation, blood in stool, abdominal distention and anal bleeding.  Genitourinary: Negative for hematuria, vaginal bleeding and difficulty  urinating.  Musculoskeletal: Negative for arthralgias.  Skin: Negative for rash and wound.  Neurological: Negative for seizures, syncope and headaches.  Hematological: Negative for adenopathy. Does not bruise/bleed easily.  Psychiatric/Behavioral: Negative for confusion.  Blood pressure 130/80, pulse 84, temperature 98.6 F (37 C), temperature source Temporal, resp. rate 15, height 5\' 2"  (1.575 m), weight 160 lb 3.2 oz (72.666 kg).  Physical Exam  Physical Exam  Vitals reviewed.  Constitutional: She appears well-developed and well-nourished.  Neck: Neck supple.  Cardiovascular: Normal rate, regular rhythm and normal heart sounds.  Pulmonary/Chest: Effort normal and breath sounds normal. She has no wheezes. She has no rales. Right breast exhibits no inverted nipple, no mass, no nipple discharge, no skin change and no tenderness. Left breast exhibits no inverted nipple, no mass, no nipple discharge, no skin change and no tenderness.  Genitourinary: Rectal exam shows internal hemorrhoid (small). Rectal exam shows no mass and no tenderness.     Lymphadenopathy:  She has no cervical adenopathy.  She has no axillary adenopathy.  Right: No supraclavicular adenopathy present.  Left: No supraclavicular adenopathy present.   Assessment  1. Left breast core biopsy with FEA  2. Genital condyloma  Plan  1. We discussed a left breast wire-guided excisional biopsy for the diagnosis of flat epithelial atypia. There do appear to be some residual calcifications and areas a possibility that there could be carcinoma associated with this and I quoted her a risk of about 5-15%. We discussed the performance of  a wire-guided excision and the indications for that. We discussed the risks and benefits associated with that.  2. 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 I did an anoscopy today and I did not see any in the rectum. I did discuss fulgurating these condyloma at the same time  that we do her breast excisional biopsy. We discussed that these would come back at some point due to the nature of the disease. I will also plan doing doing a proctoscopy at the same time to assure there is no rectal disease.

## 2013-07-11 NOTE — Op Note (Signed)
Preoperative diagnosis: left breast mammographic abnormality with core biopsy showing flat epithelial atypia, anal warts Postoperative diagnosis: same as above Procedure: 1. Left breast wire guided excisional biopsy 2. Fulguration of peranal warts 3. Proctoscopy Surgeon: Dr. Harden Mo Anesthesia: Gen. Specimens: Left breast mass marked with a short stitch superior, long stitch lateral, double stitch deep Consultations: None Estimated blood loss: Minimal Disposition to recovery in stable condition Sponge needle counts correct completion  Indications: This is a 47 yo female who underwent routine screening mammograms on her left breast microcalcifications. A core biopsy showed flat epithelial atypia and this was recommended for excision. We discussed there is a small chance of any cancer in this area. She also complained of some long-standing genital warts which she wanted treated as well.    Procedure: After informed consent was obtained the patient was taken to the operating room. She was given 2 g of intravenous cefazolin. Sequential compression devices on her legs. She was placed under general anesthesia without complication. Her left breast was prepped and draped in the standard sterile surgical fashion. A surgical timeout was performed.  The wire was in place. I had the mammograms in the operating room. I infiltrated Marcaine throughout the region of the wire. I then made a curvilinear incision in the superior aspect of the breast. I brought the wire in from its remote position. I  then used cautery to excise the wire and the surrounding tissue. This was then marked as above. I then did a mammogram in the operating room which confirmed removal of the clips and both sets of calcifications. This was confirmed by radiology as well. I then obtained hemostasis. I then closed with 2-0 Vicryl, 3-0 Vicryl, 4 Monocryl. I injected more Marcaine throughout the area. Dermabond and Steri-Strips were  placed over this wound. We took this set down and proceeded to place her in lithotomy position and appropriately padded her.  I did a  rigid proctoscopy first. I had done an anoscopy in the office and not identified any lesions and no further lesions were identified on rigid proctoscopy. I then fulgurated about 20 small perianal warts. There were no further disease after completion. I placed some lidocaine ointment and a dressing over this. She tolerates well was extubated and transferred to recovery stable.

## 2013-07-11 NOTE — Anesthesia Procedure Notes (Signed)
Procedure Name: LMA Insertion Date/Time: 07/11/2013 11:36 AM Performed by: Burna Cash Pre-anesthesia Checklist: Patient identified, Emergency Drugs available, Suction available and Patient being monitored Patient Re-evaluated:Patient Re-evaluated prior to inductionOxygen Delivery Method: Circle System Utilized Preoxygenation: Pre-oxygenation with 100% oxygen Intubation Type: IV induction Ventilation: Mask ventilation without difficulty LMA: LMA inserted LMA Size: 4.0 Number of attempts: 1 Airway Equipment and Method: bite block Placement Confirmation: positive ETCO2 Tube secured with: Tape Dental Injury: Teeth and Oropharynx as per pre-operative assessment

## 2013-07-11 NOTE — Anesthesia Postprocedure Evaluation (Signed)
Anesthesia Post Note  Patient: Savannah Sparks  Procedure(s) Performed: Procedure(s) (LRB): BREAST BIOPSY WITH NEEDLE LOCALIZATION (Left) FULGURATION ANAL WART WITH EXAM UNDER ANESTHESIA (N/A)  Anesthesia type: general  Patient location: PACU  Post pain: Pain level controlled  Post assessment: Patient's Cardiovascular Status Stable  Last Vitals:  Filed Vitals:   07/11/13 1336  BP: 124/76  Pulse: 72  Temp: 36.6 C  Resp: 16    Post vital signs: Reviewed and stable  Level of consciousness: sedated  Complications: No apparent anesthesia complications

## 2013-07-11 NOTE — Anesthesia Preprocedure Evaluation (Addendum)
Anesthesia Evaluation  Patient identified by MRN, date of birth, ID band Patient awake    Reviewed: Allergy & Precautions, H&P , NPO status , Patient's Chart, lab work & pertinent test results  History of Anesthesia Complications Negative for: history of anesthetic complications  Airway Mallampati: I TM Distance: >3 FB Neck ROM: Full    Dental   Pulmonary Current Smoker,          Cardiovascular negative cardio ROS      Neuro/Psych negative neurological ROS  negative psych ROS   GI/Hepatic   Endo/Other    Renal/GU      Musculoskeletal   Abdominal   Peds  Hematology   Anesthesia Other Findings   Reproductive/Obstetrics                          Anesthesia Physical Anesthesia Plan  ASA: II  Anesthesia Plan: General   Post-op Pain Management:    Induction: Intravenous  Airway Management Planned: LMA  Additional Equipment:   Intra-op Plan:   Post-operative Plan: Extubation in OR  Informed Consent: I have reviewed the patients History and Physical, chart, labs and discussed the procedure including the risks, benefits and alternatives for the proposed anesthesia with the patient or authorized representative who has indicated his/her understanding and acceptance.     Plan Discussed with: CRNA and Surgeon  Anesthesia Plan Comments:        Anesthesia Quick Evaluation

## 2013-07-15 ENCOUNTER — Telehealth (INDEPENDENT_AMBULATORY_CARE_PROVIDER_SITE_OTHER): Payer: Self-pay

## 2013-07-15 ENCOUNTER — Encounter (HOSPITAL_BASED_OUTPATIENT_CLINIC_OR_DEPARTMENT_OTHER): Payer: Self-pay | Admitting: General Surgery

## 2013-07-15 NOTE — Telephone Encounter (Signed)
LMOM for pt to call our office. I want pt to know her pathology report shows good news with no cancer per Dr Dwain Sarna just fibrocystic changes.

## 2013-07-16 NOTE — Telephone Encounter (Signed)
Pt called in for message and updated.

## 2013-07-25 ENCOUNTER — Encounter (INDEPENDENT_AMBULATORY_CARE_PROVIDER_SITE_OTHER): Payer: 59 | Admitting: General Surgery

## 2013-07-29 ENCOUNTER — Encounter (INDEPENDENT_AMBULATORY_CARE_PROVIDER_SITE_OTHER): Payer: Self-pay

## 2013-07-29 ENCOUNTER — Encounter (INDEPENDENT_AMBULATORY_CARE_PROVIDER_SITE_OTHER): Payer: Self-pay | Admitting: General Surgery

## 2013-07-29 ENCOUNTER — Ambulatory Visit (INDEPENDENT_AMBULATORY_CARE_PROVIDER_SITE_OTHER): Payer: 59 | Admitting: General Surgery

## 2013-07-29 VITALS — BP 118/68 | HR 80 | Temp 98.4°F | Resp 14 | Ht 62.0 in | Wt 165.4 lb

## 2013-07-29 DIAGNOSIS — Z09 Encounter for follow-up examination after completed treatment for conditions other than malignant neoplasm: Secondary | ICD-10-CM

## 2013-07-29 NOTE — Progress Notes (Signed)
Subjective:     Patient ID: Christiana Fuchs, female   DOB: 01-31-1966, 47 y.o.   MRN: [id number]  HPI This is a 47 year old female who had an abnormal left breast core biopsy and mammogram that showed flat epithelial atypia. She also has a history of anal warts. I took her to the operating room recently and did a wire-guided excisional biopsy with the final pathology showing flat epithelial atypia with associated calcification. We also fulgurated her anal warts at the same time. She returns doing well from both of those today without any significant complaints.  Review of Systems     Objective:   Physical Exam Left breast incision healing well without infection    Assessment:     S/p left breast excisional biopsy S/p anal wart fulguration     Plan:     I think she is doing well from the fulguration. We discussed that these may recur.  We discussed her breasts excisional biopsy pathology. She is able to return to full activity. I will see her back as needed. She needs to continue with her own self exams monthly, annual exam clinically, and yearly mammograms.

## 2013-07-29 NOTE — Patient Instructions (Signed)

## 2013-07-30 ENCOUNTER — Ambulatory Visit (INDEPENDENT_AMBULATORY_CARE_PROVIDER_SITE_OTHER): Payer: 59 | Admitting: Family Medicine

## 2013-07-30 VITALS — BP 118/72 | HR 100 | Temp 97.9°F | Resp 16 | Ht 62.0 in | Wt 165.0 lb

## 2013-07-30 DIAGNOSIS — J209 Acute bronchitis, unspecified: Secondary | ICD-10-CM

## 2013-07-30 DIAGNOSIS — F17200 Nicotine dependence, unspecified, uncomplicated: Secondary | ICD-10-CM

## 2013-07-30 MED ORDER — HYDROCOD POLST-CHLORPHEN POLST 10-8 MG/5ML PO LQCR: 5.0000 mL | Freq: Two times a day (BID) | ORAL | Status: DC | PRN

## 2013-07-30 MED ORDER — AZITHROMYCIN 250 MG PO TABS: ORAL_TABLET | ORAL | Status: DC

## 2013-07-30 NOTE — Patient Instructions (Signed)
Acute Bronchitis Bronchitis is inflammation of the airways that extend from the windpipe into the lungs (bronchi). The inflammation often causes mucus to develop. This leads to a cough, which is the most common symptom of bronchitis.  In acute bronchitis, the condition usually develops suddenly and goes away over time, usually in a couple weeks. Smoking, allergies, and asthma can make bronchitis worse. Repeated episodes of bronchitis may cause further lung problems.  CAUSES Acute bronchitis is most often caused by the same virus that causes a cold. The virus can spread from person to person (contagious).  SIGNS AND SYMPTOMS   Cough.   Fever.   Coughing up mucus.   Body aches.   Chest congestion.   Chills.   Shortness of breath.   Sore throat.  DIAGNOSIS  Acute bronchitis is usually diagnosed through a physical exam. Tests, such as chest X-rays, are sometimes done to rule out other conditions.  TREATMENT  Acute bronchitis usually goes away in a couple weeks. Often times, no medical treatment is necessary. Medicines are sometimes given for relief of fever or cough. Antibiotics are usually not needed but may be prescribed in certain situations. In some cases, an inhaler may be recommended to help reduce shortness of breath and control the cough. A cool mist vaporizer may also be used to help thin bronchial secretions and make it easier to clear the chest.  HOME CARE INSTRUCTIONS  Get plenty of rest.   Drink enough fluids to keep your urine clear or pale yellow (unless you have a medical condition that requires fluid restriction). Increasing fluids may help thin your secretions and will prevent dehydration.   Only take over-the-counter or prescription medicines as directed by your health care provider.   Avoid smoking and secondhand smoke. Exposure to cigarette smoke or irritating chemicals will make bronchitis worse. If you are a smoker, consider using nicotine gum or skin  patches to help control withdrawal symptoms. Quitting smoking will help your lungs heal faster.   Reduce the chances of another bout of acute bronchitis by washing your hands frequently, avoiding people with cold symptoms, and trying not to touch your hands to your mouth, nose, or eyes.   Follow up with your health care provider as directed.  SEEK MEDICAL CARE IF: Your symptoms do not improve after 1 week of treatment.  SEEK IMMEDIATE MEDICAL CARE IF:  You develop an increased fever or chills.   You have chest pain.   You have severe shortness of breath.  You have bloody sputum.   You develop dehydration.  You develop fainting.  You develop repeated vomiting.  You develop a severe headache. MAKE SURE YOU:   Understand these instructions.  Will watch your condition.  Will get help right away if you are not doing well or get worse. Document Released: 09/01/2004 Document Revised: 03/27/2013 Document Reviewed: 01/15/2013 [employer] Patient Information 2014 [employer].  

## 2013-07-30 NOTE — Progress Notes (Addendum)
Subjective:  This chart was scribed for Levell July. Clelia Croft, MD  by Ashley Jacobs, [facility] Scribe. The patient was seen in room and the patient's care was started at 8:47 AM. Chief Complaint  Patient presents with  . Cough    4days-No production  . Fatigue  . Wheezing    HPI Comments: Savannah Sparks is a 47 y.o. female who arrives to the [facility] for evaluation - care started at 8:25 AM.   Patient ID: Savannah Sparks, female    DOB: [birth year], 47 y.o.   MRN: [id number]  Cough This is a new problem. Associated symptoms include postnasal drip and wheezing. Pertinent negatives include no chest pain, chills, ear pain, fever, rhinorrhea, sore throat or shortness of breath.  Wheezing  Associated symptoms include coughing. Pertinent negatives include no chest pain, chills, ear pain, fever, rhinorrhea, shortness of breath, sore throat or vomiting.   HPI Comments: Savannah Sparks is a 47 y.o. female who presents to [facility] complaining of persistent, moderate, productive cough the past four days. Pt's husband has similar symptoms and was treated with a Z-Pak a few days prior w/ much improvement so she was hoping for the same. Pt has the associated symptoms of chest congestion, fatigue, and wheezing. She denies ear pain, nasal congestion, and sore throat. Pt has tried Chest and Congestion OTC cough medicine last night without relief.   She smokes 1.5 packs per day.   She does not have any known allergies to medications.    Past Medical History  Diagnosis Date  . Wears glasses   . Chronic pain     neck-back   Current Outpatient Prescriptions on File Prior to Visit  Medication Sig Dispense Refill  . ibuprofen (ADVIL,MOTRIN) 800 MG tablet       . oxymorphone (OPANA) 10 MG tablet 10 mg every 8 (eight) hours as needed.        No current facility-administered medications on file prior to visit.  No Known Allergies    Review of Systems    Constitutional: Positive for fatigue. Negative for fever, chills, diaphoresis and activity change.  HENT: Positive for congestion and postnasal drip. Negative for ear pain, rhinorrhea, sinus pressure, sneezing and sore throat.   Respiratory: Positive for cough, chest tightness and wheezing. Negative for shortness of breath and stridor.   Cardiovascular: Negative for chest pain, palpitations and leg swelling.  Gastrointestinal: Negative for vomiting.  Hematological: Negative for adenopathy.  Psychiatric/Behavioral: Positive for sleep disturbance.   BP 118/72  Pulse 100  Temp(Src) 97.9 F (36.6 C) (Oral)  Resp 16  Ht 5\' 2"  (1.575 m)  Wt 165 lb (74.844 kg)  BMI 30.17 kg/m2  SpO2 96%  LMP 06/22/2013 Objective:   Physical Exam  Nursing note and vitals reviewed. Constitutional: She is oriented to person, place, and time. She appears well-developed and well-nourished. She appears lethargic. She appears ill. No distress.  HENT:  Head: Normocephalic and atraumatic.  Right Ear: External ear normal. Tympanic membrane is injected. Tympanic membrane is not erythematous and not bulging.  Left Ear: External ear normal. Tympanic membrane is injected. Tympanic membrane is not erythematous and not bulging.  Nose: Mucosal edema present. No rhinorrhea.  Mouth/Throat: Uvula is midline. No trismus in the jaw. No uvula swelling. Posterior oropharyngeal erythema present. No oropharyngeal exudate, posterior oropharyngeal edema or tonsillar abscesses.  Bilateral canals partially occluded dried cerumen Mild oropharyngeal white streaking due to PND  Eyes: EOM are  normal. Pupils are equal, round, and reactive to light.  Neck: Normal range of motion. Neck supple. No rigidity. No tracheal deviation, no edema, no erythema and normal range of motion present. No mass and no thyromegaly present.  Cardiovascular: Normal rate, regular rhythm and normal heart sounds.  Exam reveals no gallop and no friction rub.   No  murmur heard. Pulmonary/Chest: Effort normal and breath sounds normal. No respiratory distress. She has no wheezes. She has no rales.  Abdominal: Soft. She exhibits no distension.  Musculoskeletal: Normal range of motion.  Lymphadenopathy:       Head (right side): No submandibular, no tonsillar, no preauricular and no posterior auricular adenopathy present.       Head (left side): No submandibular, no tonsillar, no preauricular and no posterior auricular adenopathy present.    She has no cervical adenopathy.       Right: No supraclavicular adenopathy present.       Left: No supraclavicular adenopathy present.  Neurological: She is oriented to person, place, and time. She appears lethargic.  Skin: Skin is warm and dry. She is not diaphoretic.  Psychiatric: She has a normal mood and affect. Her behavior is normal.      Assessment & Plan:  Acute bronchitis Needs to stop smoking. Meds ordered this encounter  Medications  . chlorpheniramine-HYDROcodone (TUSSIONEX PENNKINETIC ER) 10-8 MG/5ML LQCR    Sig: Take 5 mLs by mouth every 12 (twelve) hours as needed for cough (cough).    Dispense:  160 mL    Refill:  0  . azithromycin (ZITHROMAX) 250 MG tablet    Sig: Take 2 tabs PO x 1 dose, then 1 tab PO QD x 4 days    Dispense:  6 tablet    Refill:  0    I personally performed the services described in this documentation, which was scribed in my presence. The recorded information has been reviewed and considered, and addended by me as needed.  Norberto Sorenson, MD MPH

## 2013-08-11 DIAGNOSIS — Z720 Tobacco use: Secondary | ICD-10-CM | POA: Insufficient documentation

## 2013-08-11 DIAGNOSIS — F17200 Nicotine dependence, unspecified, uncomplicated: Secondary | ICD-10-CM | POA: Insufficient documentation

## 2014-04-28 ENCOUNTER — Telehealth (INDEPENDENT_AMBULATORY_CARE_PROVIDER_SITE_OTHER): Payer: Self-pay

## 2014-04-28 DIAGNOSIS — N6012 Diffuse cystic mastopathy of left breast: Secondary | ICD-10-CM

## 2014-04-28 DIAGNOSIS — Z1239 Encounter for other screening for malignant neoplasm of breast: Secondary | ICD-10-CM

## 2014-04-28 NOTE — Telephone Encounter (Signed)
Pt requesting a diagnostic bilateral mgm due to breast pain and hx of lumpectomy in 2014 b/c of fibrocystic changes. Placed referral in epic for bilateral dg mgm to be ordered at [facility].

## 2014-04-30 ENCOUNTER — Other Ambulatory Visit (INDEPENDENT_AMBULATORY_CARE_PROVIDER_SITE_OTHER): Payer: Self-pay | Admitting: General Surgery

## 2014-05-05 ENCOUNTER — Ambulatory Visit
Admission: RE | Admit: 2014-05-05 | Discharge: 2014-05-05 | Disposition: A | Discharge status: Home / Self Care | Payer: 59 | Source: Ambulatory Visit | Attending: General Surgery | Admitting: General Surgery

## 2014-05-05 ENCOUNTER — Encounter (INDEPENDENT_AMBULATORY_CARE_PROVIDER_SITE_OTHER): Payer: Self-pay

## 2014-05-05 DIAGNOSIS — Z1239 Encounter for other screening for malignant neoplasm of breast: Secondary | ICD-10-CM

## 2014-05-05 DIAGNOSIS — N6012 Diffuse cystic mastopathy of left breast: Secondary | ICD-10-CM

## 2015-05-22 ENCOUNTER — Ambulatory Visit: Payer: Self-pay

## 2015-05-22 ENCOUNTER — Ambulatory Visit
Admission: RE | Admit: 2015-05-22 | Discharge: 2015-05-22 | Disposition: A | Discharge status: Home / Self Care | Payer: 59 | Source: Ambulatory Visit

## 2015-05-22 ENCOUNTER — Other Ambulatory Visit: Payer: Self-pay

## 2015-05-22 DIAGNOSIS — Z1231 Encounter for screening mammogram for malignant neoplasm of breast: Secondary | ICD-10-CM

## 2015-09-18 ENCOUNTER — Other Ambulatory Visit: Payer: Self-pay | Admitting: Family Medicine

## 2015-09-18 DIAGNOSIS — E041 Nontoxic single thyroid nodule: Secondary | ICD-10-CM

## 2015-10-01 ENCOUNTER — Ambulatory Visit
Admission: RE | Admit: 2015-10-01 | Discharge: 2015-10-01 | Disposition: A | Discharge status: Home / Self Care | Payer: 59 | Source: Ambulatory Visit | Attending: Family Medicine | Admitting: Family Medicine

## 2015-10-01 DIAGNOSIS — E041 Nontoxic single thyroid nodule: Secondary | ICD-10-CM

## 2016-05-05 ENCOUNTER — Other Ambulatory Visit: Payer: Self-pay | Admitting: Obstetrics and Gynecology

## 2016-05-05 DIAGNOSIS — Z1231 Encounter for screening mammogram for malignant neoplasm of breast: Secondary | ICD-10-CM

## 2016-05-23 ENCOUNTER — Ambulatory Visit
Admission: RE | Admit: 2016-05-23 | Discharge: 2016-05-23 | Disposition: A | Discharge status: Home / Self Care | Payer: 59 | Source: Ambulatory Visit | Attending: Obstetrics and Gynecology | Admitting: Obstetrics and Gynecology

## 2016-05-23 DIAGNOSIS — Z1231 Encounter for screening mammogram for malignant neoplasm of breast: Secondary | ICD-10-CM

## 2016-05-24 ENCOUNTER — Ambulatory Visit: Payer: 59

## 2016-07-11 ENCOUNTER — Ambulatory Visit (INDEPENDENT_AMBULATORY_CARE_PROVIDER_SITE_OTHER): Payer: 59

## 2016-07-11 ENCOUNTER — Ambulatory Visit (HOSPITAL_COMMUNITY)
Admission: EM | Admit: 2016-07-11 | Discharge: 2016-07-11 | Disposition: A | Discharge status: Home / Self Care | Payer: 59 | Attending: Family Medicine | Admitting: Family Medicine

## 2016-07-11 ENCOUNTER — Encounter (HOSPITAL_COMMUNITY): Payer: Self-pay | Admitting: Family Medicine

## 2016-07-11 DIAGNOSIS — S63634A Sprain of interphalangeal joint of right ring finger, initial encounter: Secondary | ICD-10-CM

## 2016-07-11 NOTE — ED Provider Notes (Addendum)
[facility]    CSN: [id number] Arrival date & time: 07/11/16  1837     History   Chief Complaint Chief Complaint  Patient presents with  . Finger Injury    HPI Savannah FuchsLinda G Marandola is a 50 y.o. female.   This Is a 50 year old woman who thinks that she may have a right ring finger fracture. She was carrying heavy book bag and the weight of the bag hyperextended the finger earlier today.   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 Chronic pain    neck-back  . Wears glasses     Patient Active Problem List   Diagnosis Date Noted  . Tobacco use disorder 08/11/2013    Past Surgical History:  Procedure Laterality Date  . BREAST BIOPSY Left 07/11/2013   Procedure: BREAST BIOPSY WITH NEEDLE LOCALIZATION;  Surgeon: Emelia LoronMatthew Wakefield, MD;  Location: [facility];  Service: General;  Laterality: Left;  . BREAST SURGERY     lt br bx-neg  . CERVICAL FUSION  2009   c4-5  . DILATION AND CURETTAGE OF UTERUS  2005  . MINOR FULGERATION OF ANAL CONDYLOMA  2004  . WART FULGURATION N/A 07/11/2013   Procedure: FULGURATION ANAL WART WITH EXAM UNDER ANESTHESIA;  Surgeon: Emelia LoronMatthew Wakefield, MD;  Location: [facility];  Service: General;  Laterality: N/A;    OB History    No data available       Home Medications    Prior to Admission medications   Not on File    Family History History reviewed. No pertinent family history.  Social History Social History  Substance Use Topics  . Smoking status: Current Every Day Smoker    Packs/day: 1.50  . Smokeless tobacco: Never Used  . Alcohol use Yes     Allergies   Patient has no known allergies.   Review of Systems Review of Systems  Constitutional: Negative.      Physical Exam Triage Vital Signs ED Triage Vitals [07/11/16 1924]  Enc Vitals Group     BP 135/75     Pulse Rate 64     Resp 14     Temp 98.2  F (36.8 C)     Temp Source Oral     SpO2 100 %     Weight      Height      Head Circumference      Peak Flow      Pain Score      Pain Loc      Pain Edu?      Excl. in GC?    No data found.   Updated Vital Signs BP 135/75 (BP Location: Left Arm)   Pulse 64   Temp 98.2 F (36.8 C) (Oral)   Resp 14   SpO2 100%   Physical Exam  Constitutional: She is oriented to person, place, and time. She appears well-developed and well-nourished.  HENT:  Right Ear: External ear normal.  Left Ear: External ear normal.  Mouth/Throat: Oropharynx is clear and moist.  Eyes: Conjunctivae and EOM are normal.  Neck: Normal range of motion. Neck supple.  Pulmonary/Chest: Effort normal.  Musculoskeletal:  Tender, mildly swollen PIP joint of the right ring finger with diminished flexion.  Neurological: She is alert and oriented to person, place, and time.  Skin: Skin is warm and dry.  Nursing note and vitals reviewed.  UC Treatments / Results  Labs (all labs ordered are listed, but only abnormal results are displayed) Labs Reviewed - No data to display  EKG  EKG Interpretation None       Radiology Dg Finger Ring Right  Result Date: 07/11/2016 CLINICAL DATA:  Right ring finger pain since a hyperextension injury picking up a pocketbook 1 week ago. Initial encounter. EXAM: RIGHT RING FINGER 2+V COMPARISON:  None. FINDINGS: There is no evidence of fracture or dislocation. There is no evidence of arthropathy or other focal bone abnormality. Soft tissues are unremarkable. IMPRESSION: Negative exam. Electronically Signed   By: Drusilla Kannerhomas  Dalessio M.D.   On: 07/11/2016 19:52    Procedures Procedures (including critical care time)  Medications Ordered in UC Medications - No data to display   Initial Impression / Assessment and Plan / UC Course  I have reviewed the triage vital signs and the nursing notes.  Pertinent labs & imaging results that were available during my care of the  patient were reviewed by me and considered in my medical decision making (see chart for details).  Clinical Course     Final Clinical Impressions(s) / UC Diagnoses   Final diagnoses:  Sprain of interphalangeal joint of right ring finger, initial encounter    New Prescriptions Current Discharge Medication List    Buddy taping for one week to 10 days.   Elvina SidleKurt Enaya Howze, MD 07/11/16 [id number]    Elvina SidleKurt Yailen Zemaitis, MD 07/11/16 2011

## 2016-07-11 NOTE — ED Triage Notes (Signed)
[facility] a [facility] pain to the ring finger on her right hand x 2 weeks. The patient reported that she injured it carrying a bag.

## 2016-10-10 ENCOUNTER — Emergency Department (HOSPITAL_COMMUNITY)
Admission: EM | Admit: 2016-10-10 | Discharge: 2016-10-10 | Disposition: A | Discharge status: Home / Self Care | Payer: 59 | Attending: Emergency Medicine | Admitting: Emergency Medicine

## 2016-10-10 ENCOUNTER — Emergency Department (HOSPITAL_COMMUNITY): Payer: 59

## 2016-10-10 ENCOUNTER — Encounter (HOSPITAL_COMMUNITY): Payer: Self-pay | Admitting: Emergency Medicine

## 2016-10-10 DIAGNOSIS — R071 Chest pain on breathing: Secondary | ICD-10-CM | POA: Diagnosis present

## 2016-10-10 DIAGNOSIS — Y999 Unspecified external cause status: Secondary | ICD-10-CM | POA: Diagnosis not present

## 2016-10-10 DIAGNOSIS — M542 Cervicalgia: Secondary | ICD-10-CM | POA: Diagnosis not present

## 2016-10-10 DIAGNOSIS — Y939 Activity, unspecified: Secondary | ICD-10-CM | POA: Diagnosis not present

## 2016-10-10 DIAGNOSIS — R0789 Other chest pain: Secondary | ICD-10-CM | POA: Insufficient documentation

## 2016-10-10 DIAGNOSIS — Y92410 Unspecified street and highway as the place of occurrence of the external cause: Secondary | ICD-10-CM | POA: Insufficient documentation

## 2016-10-10 DIAGNOSIS — F17200 Nicotine dependence, unspecified, uncomplicated: Secondary | ICD-10-CM | POA: Insufficient documentation

## 2016-10-10 MED ORDER — OXYCODONE-ACETAMINOPHEN 5-325 MG PO TABS
1.0000 | ORAL_TABLET | Freq: Once | ORAL | Status: AC
  Administered 2016-10-10: 1 via ORAL
  Filled 2016-10-10: qty 1

## 2016-10-10 MED ORDER — METHOCARBAMOL 500 MG PO TABS: 500.0000 mg | ORAL_TABLET | Freq: Two times a day (BID) | ORAL | 0 refills | Status: DC

## 2016-10-10 MED ORDER — OXYCODONE-ACETAMINOPHEN 5-325 MG PO TABS: 1.0000 | ORAL_TABLET | ORAL | 0 refills | Status: DC | PRN
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
## 2016-10-12 ENCOUNTER — Encounter (HOSPITAL_COMMUNITY): Payer: Self-pay | Admitting: Emergency Medicine

## 2016-10-12 ENCOUNTER — Emergency Department (HOSPITAL_COMMUNITY)
Admission: EM | Admit: 2016-10-12 | Discharge: 2016-10-12 | Disposition: A | Discharge status: Home / Self Care | Payer: 59 | Attending: Emergency Medicine | Admitting: Emergency Medicine

## 2016-10-12 ENCOUNTER — Emergency Department (HOSPITAL_COMMUNITY): Payer: 59

## 2016-10-12 DIAGNOSIS — F17200 Nicotine dependence, unspecified, uncomplicated: Secondary | ICD-10-CM | POA: Diagnosis not present

## 2016-10-12 DIAGNOSIS — M542 Cervicalgia: Secondary | ICD-10-CM

## 2016-10-12 DIAGNOSIS — Z79899 Other long term (current) drug therapy: Secondary | ICD-10-CM | POA: Diagnosis not present

## 2016-10-12 DIAGNOSIS — Y939 Activity, unspecified: Secondary | ICD-10-CM | POA: Diagnosis not present

## 2016-10-12 DIAGNOSIS — Y999 Unspecified external cause status: Secondary | ICD-10-CM | POA: Insufficient documentation

## 2016-10-12 DIAGNOSIS — Y92410 Unspecified street and highway as the place of occurrence of the external cause: Secondary | ICD-10-CM | POA: Diagnosis not present

## 2016-10-12 MED ORDER — IBUPROFEN 200 MG PO TABS
600.0000 mg | ORAL_TABLET | Freq: Once | ORAL | Status: AC
  Administered 2016-10-12: 600 mg via ORAL
  Filled 2016-10-12: qty 3

## 2016-10-12 NOTE — Discharge Instructions (Signed)
Please read instructions below. Talk with your primary care provider about any new medications. Return to ED uncontrolled worsening pain, new numbness or tingling, fever.
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
## 2017-05-17 ENCOUNTER — Other Ambulatory Visit: Payer: Self-pay | Admitting: Obstetrics and Gynecology

## 2017-05-17 DIAGNOSIS — Z1231 Encounter for screening mammogram for malignant neoplasm of breast: Secondary | ICD-10-CM

## 2017-05-30 ENCOUNTER — Ambulatory Visit
Admission: RE | Admit: 2017-05-30 | Discharge: 2017-05-30 | Disposition: A | Discharge status: Home / Self Care | Payer: 59 | Source: Ambulatory Visit | Attending: Obstetrics and Gynecology | Admitting: Obstetrics and Gynecology

## 2017-05-30 DIAGNOSIS — Z1231 Encounter for screening mammogram for malignant neoplasm of breast: Secondary | ICD-10-CM

## 2017-06-09 ENCOUNTER — Ambulatory Visit: Payer: 59

## 2017-06-20 ENCOUNTER — Encounter (INDEPENDENT_AMBULATORY_CARE_PROVIDER_SITE_OTHER): Payer: Self-pay | Admitting: Orthopaedic Surgery

## 2017-06-20 ENCOUNTER — Ambulatory Visit (INDEPENDENT_AMBULATORY_CARE_PROVIDER_SITE_OTHER): Payer: 59

## 2017-06-20 ENCOUNTER — Ambulatory Visit: Payer: Self-pay | Admitting: Surgery

## 2017-06-20 ENCOUNTER — Ambulatory Visit (INDEPENDENT_AMBULATORY_CARE_PROVIDER_SITE_OTHER): Payer: 59 | Admitting: Orthopaedic Surgery

## 2017-06-20 DIAGNOSIS — M7711 Lateral epicondylitis, right elbow: Secondary | ICD-10-CM | POA: Diagnosis not present

## 2017-06-20 MED ORDER — PREDNISONE 10 MG (21) PO TBPK: ORAL_TABLET | ORAL | 0 refills | Status: DC

## 2017-06-20 MED ORDER — DICLOFENAC SODIUM 1 % TD GEL: 2.0000 g | Freq: Four times a day (QID) | TRANSDERMAL | 5 refills | Status: DC

## 2017-06-20 MED ORDER — NAPROXEN 500 MG PO TABS: 500.0000 mg | ORAL_TABLET | Freq: Two times a day (BID) | ORAL | 3 refills | Status: DC

## 2017-06-20 NOTE — H&P (Signed)
Savannah FuchsLinda G Osmundson 06/20/2017 9:53 AM Location: [facility] Patient #: [id number] DOB: 03/24/1966 Married / Language: [place] / Race: White Female  History of Present Illness Savannah Sparks(Bertina Guthridge C. Lyndsie Wallman MD; 06/20/2017 10:37 AM) The patient is a 51 year old female who presents with anal lesions. Note for "Anal lesions": ` ` ` Patient sent for surgical consultation at the request of Dr. Leone PayorMarc Anderson  Chief Complaint: Perianal warts  The patient is a pleasant smoking female that had some perineal condyloma. Treated with laser years ago by her gynecologist. Rather painful. Then had a breast biopsy and cautery ablation of perianal warts and 2014. She is concerned that she has some new or persistent areas between her anus and vagina. She discussed with her gynecologist. Surgical consultation requested. She's not HIV positive. No unprotected sex. No history of anal sex. She does smoke. She is due for a screening colonoscopy next week. I believe through wait for she diversity. She usually moves her bowels every day. Otherwise physically active. 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  No personal nor family history of GI/colon cancer, inflammatory bowel disease, irritable bowel syndrome, allergy such as Celiac Sprue, dietary/dairy problems, colitis, ulcers nor gastritis. No recent sick contacts/gastroenteritis. No travel outside the country. No changes in diet. No dysphagia to solids or liquids. No significant heartburn or reflux. No hematochezia, hematemesis, coffee ground emesis. No evidence of prior gastric/peptic ulceration.  (Review of systems as stated in this history (HPI) or in the review of systems. Otherwise all other 12 point ROS are negative)   Past Surgical History Juanita Craver(Armen Glenn, CMA; 06/20/2017 9:53 AM) No pertinent past surgical history  Diagnostic Studies History Juanita Craver(Armen Glenn, CMA; 06/20/2017 9:53 AM) Colonoscopy  never Mammogram within last year Pap Smear 1-5 years ago  Allergies Juanita Craver(Armen Glenn, CMA; 06/20/2017 9:55 AM) No Known Allergies 06/20/2017  Medication History Juanita Craver(Armen Glenn, CMA; 06/20/2017 9:55 AM) Dante Gangpana ER (10MG  Tab 12HR Deter, Oral) Active. Medications Reconciled  Social History Juanita Craver(Armen Glenn, CMA; 06/20/2017 9:53 AM) Alcohol use Occasional alcohol use. Caffeine use Coffee. No drug use Tobacco use Current every day smoker.  Family History Juanita Craver(Armen Glenn, CMA; 06/20/2017 9:53 AM) First Degree Relatives No pertinent family history  Pregnancy / Birth History Juanita Craver(Armen Glenn, CMA; 06/20/2017 9:53 AM) Age at menarche 13 years. Age of menopause 7046-50 Gravida 1 Irregular periods Length (months) of breastfeeding 3-6 Maternal age 50-30 Para 1  Other Problems (Armen Sherrine MaplesGlenn, CMA; 06/20/2017 9:53 AM) No pertinent past medical history     Review of Systems Juanita Craver(Armen Glenn CMA; 06/20/2017 9:53 AM) General Not Present- Appetite Loss, Chills, Fatigue, Fever, Night Sweats, Weight Gain and Weight Loss. Skin Present- Change in Wart/Mole. Not Present- Dryness, Hives, Jaundice, New Lesions, Non-Healing Wounds, Rash and Ulcer. HEENT Not Present- Earache, Hearing Loss, Hoarseness, Nose Bleed, Oral Ulcers, Ringing in the Ears, Seasonal Allergies, Sinus Pain, Sore Throat, Visual Disturbances, Wears glasses/contact lenses and Yellow Eyes. Respiratory Not Present- Bloody sputum, Chronic Cough, Difficulty Breathing, Snoring and Wheezing. Breast Not Present- Breast Mass, Breast Pain, Nipple Discharge and Skin Changes. Cardiovascular Not Present- Chest Pain, Difficulty Breathing Lying Down, Leg Cramps, Palpitations, Rapid Heart Rate, Shortness of Breath and Swelling of Extremities. Gastrointestinal Not Present- Abdominal Pain, Bloating, Bloody Stool, Change in Bowel Habits, Chronic diarrhea, Constipation, Difficulty Swallowing, Excessive gas, Gets full quickly at meals, Hemorrhoids,  Indigestion, Nausea, Rectal Pain and Vomiting.  Vitals (Armen Glenn CMA; 06/20/2017 9:54 AM) 06/20/2017 9:54 AM Weight: 175.5  lb Height: 62in Body Surface Area: 1.81 m Body Mass Index: 32.1 kg/m  Temp.: 98.75F  Pulse: 71 (Regular)  P.OX: 95% (Room air) BP: 126/70 (Sitting, Left Arm, Standard)      Physical Exam Savannah Sparks(Kamau Weatherall C. Armenta Erskin MD; 06/20/2017 10:18 AM)  General Mental Status-Alert. General Appearance-Not in acute distress, Not Sickly. Orientation-Oriented X3. Hydration-Well hydrated. Voice-Normal.  Integumentary Global Assessment Upon inspection and palpation of skin surfaces of the - Axillae: non-tender, no inflammation or ulceration, no drainage. and Distribution of scalp and body hair is normal. General Characteristics Temperature - normal warmth is noted.  Head and Neck Head-normocephalic, atraumatic with no lesions or palpable masses. Face Global Assessment - atraumatic, no absence of expression. Neck Global Assessment - no abnormal movements, no bruit auscultated on the right, no bruit auscultated on the left, no decreased range of motion, non-tender. Trachea-midline. Thyroid Gland Characteristics - non-tender.  Eye Eyeball - Left-Extraocular movements intact, No Nystagmus. Eyeball - Right-Extraocular movements intact, No Nystagmus. Cornea - Left-No Hazy. Cornea - Right-No Hazy. Sclera/Conjunctiva - Left-No scleral icterus, No Discharge. Sclera/Conjunctiva - Right-No scleral icterus, No Discharge. Pupil - Left-Direct reaction to light normal. Pupil - Right-Direct reaction to light normal.  ENMT Ears Pinna - Left - no drainage observed, no generalized tenderness observed. Right - no drainage observed, no generalized tenderness observed. Nose and Sinuses External Inspection of the Nose - no destructive lesion observed. Inspection of the nares - Left - quiet respiration. Right - quiet respiration. Mouth and  Throat Lips - Upper Lip - no fissures observed, no pallor noted. Lower Lip - no fissures observed, no pallor noted. Nasopharynx - no discharge present. Oral Cavity/Oropharynx - Tongue - no dryness observed. Oral Mucosa - no cyanosis observed. Hypopharynx - no evidence of airway distress observed.  Chest and Lung Exam Inspection Movements - Normal and Symmetrical. Accessory muscles - No use of accessory muscles in breathing. Palpation Palpation of the chest reveals - Non-tender. Auscultation Breath sounds - Normal and Clear.  Cardiovascular Auscultation Rhythm - Regular. Murmurs & Other Heart Sounds - Auscultation of the heart reveals - No Murmurs and No Systolic Clicks.  Abdomen Inspection Inspection of the abdomen reveals - No Visible peristalsis and No Abnormal pulsations. Umbilicus - No Bleeding, No Urine drainage. Palpation/Percussion Palpation and Percussion of the abdomen reveal - Soft, Non Tender, No Rebound tenderness, No Rigidity (guarding) and No Cutaneous hyperesthesia. Note: Abdomen soft. Not severely distended. No distasis recti. No umbilical or other anterior abdominal wall hernias  Female Genitourinary Sexual Maturity Tanner 5 - Adult hair pattern. Note: No inguinal hernias. No inguinal lymphadenopathy. A few blackheads and mild folliculitis on shaved pubic region but no condyloma warts. Nothing externally around the introitus. No vaginal bleeding nor discharge  Rectal Note: Please refer to anoscopy. Hyper and hypopigmented patches in the perianal region especially anteriorly up towards the base of the vagina. Classic condyloma or ulceration. Suspicious though.  Peripheral Vascular Upper Extremity Inspection - Left - No Cyanotic nailbeds, Not Ischemic. Right - No Cyanotic nailbeds, Not Ischemic.  Neurologic Neurologic evaluation reveals -normal attention span and ability to concentrate, able to name objects and repeat phrases. Appropriate fund of knowledge ,  normal sensation and normal coordination. Mental Status Affect - not angry, not paranoid. Cranial Nerves-Normal Bilaterally. Gait-Normal.  Neuropsychiatric Mental status exam performed with findings of-able to articulate well with normal speech/language, rate, volume and coherence, thought content normal with ability to perform basic computations and apply abstract reasoning and no evidence of hallucinations, delusions, obsessions  or homicidal/suicidal ideation.  Musculoskeletal Global Assessment Spine, Ribs and Pelvis - no instability, subluxation or laxity. Right Upper Extremity - no instability, subluxation or laxity.  Lymphatic Head & Neck  General Head & Neck Lymphatics: Bilateral - Description - No Localized lymphadenopathy. Axillary  General Axillary Region: Bilateral - Description - No Localized lymphadenopathy. Femoral & Inguinal  Generalized Femoral & Inguinal Lymphatics: Left - Description - No Localized lymphadenopathy. Right - Description - No Localized lymphadenopathy.   Results Savannah Sportsman MD; 06/20/2017 10:37 AM) Procedures  Name Value Date Hemorrhoids Procedure Anal exam: condyloma Other: Left anterior hypopigmented plaquing going towards introitus. Some dots of hyperpigmented flat masses as well. Not classic for scarring. Not classic for condyloma either. Posterior circumference of perianal region relatively clear. Sensitive in anterior anal canal but no fissure. No abscess. [place].-2 internal hemorrhoids. Prolapse. No internal rectal masses. No pruritus.  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 Possibly old scar versus anal intraepithelial neoplasia. Some perianal condyloma/scarring as well.  I think the best option would to do examination  under anesthesia, excision of atypical areas and scarring. Acetic acid staining. Probable laser ablation of other areas. Possible hemorrhoid ligation. Lithotomy positioning.  She is concerned about the area. She wishes to be aggressive and treat it.  I strongly encouraged her to quit smoking. She is trying to do that.  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 Usually treated with laser. She felt pain last time but not with cautery. I will use a laser just on the focal areas.  I did caution she will have perianal pain. Hopefully will resolve.  Current Plans Pt Education - [facility] Anal Warts (Manami Tutor) You are being scheduled for surgery- Our schedulers will call you.  You should hear from our office's scheduling department within 5 working days about the location, date, and time of surgery. We try to make accommodations for patient's preferences in scheduling surgery, but sometimes the OR schedule or the surgeon's schedule prevents [place] from making those accommodations.  If you have not heard from our office [phone]) in 5 working days, call the office and ask for your surgeon's nurse.  If you have other questions about your diagnosis, plan, or surgery, call the office and ask for your surgeon's nurse.  The anatomy & physiology of the anorectal region was discussed. The pathophysiology of anorectal warts and differential diagnosis was discussed. Natural history risks without surgery was discussed such as further growth and cancer. I stressed the importance of office follow-up to catch early recurrence & minimize/halt progression of disease. Interventions such as cauterization or cryotherapy by topical agents were discussed.  The patient's symptoms are not adequately controlled by non-operative treatments. I feel the risks & problems of no surgery outweigh the operative risks; therefore, I recommended surgery to treat  the anal warts by removal, ablation and/or cauterization.  Risks such as bleeding, infection, need for further treatment, heart attack, death, and other risks were discussed. I noted a good likelihood this will help address the problem. Goals of post-operative recovery were discussed as well. Possibility that this will not correct all symptoms was explained. Post-operative pain, bleeding, constipation, and other problems after surgery were discussed. We will work to minimize complications. Educational handouts further explaining the pathology, treatment options, and bowel regimen were given as well.  Questions were answered. The patient expresses understanding & wishes to proceed with surgery.  Surgical (Rectal exam) follow-up after resection of condyloma acuminatum (warts): - every 3 months until negative exam x1, then - every 6 months until negative exan x 1, then - every Year until negative exam x1, then  - as needed thereafter  ENCOUNTER FOR PREOPERATIVE EXAMINATION FOR GENERAL SURGICAL PROCEDURE (Z01.818)  Current Plans You are being scheduled for surgery- Our schedulers will call you.  You should hear from our office's scheduling department within 5 working days about the location, date, and time of surgery. We try to make accommodations for patient's preferences in scheduling surgery, but sometimes the OR schedule or the surgeon's schedule prevents [place] from making those accommodations.  If you have not heard from our office [phone]) in 5 working days, call the office and ask for your surgeon's nurse.  If you have other questions about your diagnosis, plan, or surgery, call the office and ask for your surgeon's nurse.  The anatomy and the physiology was discussed. The pathophysiology and natural history of the disease was discussed. Options were discussed and recommendations were made. Technique, risks, benefits, & alternatives were discussed.  Risks such as stroke, heart attack, bleeding, indection, death, and other risks discussed. Questions answered. The patient agrees to proceed. Pt Education - [facility] Rectal Prep for Anorectal outpatient/office surgery: discussed with patient and provided information. Pt Education - [facility] Rectal Surgery HCI (Jacquelynn Friend): discussed with patient and provided information. Pt Education - [facility] Good Bowel Health (Luwanda Starr) PROLAPSED INTERNAL HEMORRHOIDS, GRADE 2 (K64.1) Impression: Some irritated internal hemorrhoids. Most likely could deal with hemorrhoidal ligation. Possible concurrent hemorrhoidectomy if related to the excisional biopsies.  Current Plans Pt Education - [facility] Hemorrhoids (Layci Stenglein): discussed with patient and provided information. TOBACCO ABUSE (Z72.0)  Current Plans Pt Education - [facility] STOP SMOKING!  Savannah Sparks, M.D., F.A.C.S. Gastrointestinal and Minimally Invasive Surgery [facility] [address] [place], [place] [address] [phone] Main / Paging

## 2017-06-20 NOTE — Progress Notes (Signed)
Office Visit Note   Patient: Savannah Sparks           Date of Birth: 09/10/1965           MRN: [id number] Visit Date: 06/20/2017              Requested by: Levi AlandAnderson, Mark E, MD [address] [place], [place] [id number] PCP: Levi AlandAnderson, Mark E, MD   Assessment & Plan: Visit Diagnoses:  1. Right lateral epicondylitis     Plan: Impression is right elbow lateral epicondylitis.  Discussed condition with the patient.  Prescription for prednisone, naproxen, Voltaren gel, physical therapy.  Counterforce brace was provided.  Questions encouraged and answered.  Follow-up as needed.  Follow-Up Instructions: Return if symptoms worsen or fail to improve.   Orders:  Orders Placed This Encounter  Procedures  . XR Elbow 2 Views Right   Meds ordered this encounter  Medications  . predniSONE (STERAPRED UNI-PAK 21 TAB) 10 MG (21) TBPK tablet    Sig: Take as directed    Dispense:  21 tablet    Refill:  0  . naproxen (NAPROSYN) 500 MG tablet    Sig: Take 1 tablet (500 mg total) 2 (two) times daily with a meal by mouth.    Dispense:  30 tablet    Refill:  3  . diclofenac sodium (VOLTAREN) 1 % GEL    Sig: Apply 2 g 4 (four) times daily topically.    Dispense:  1 Tube    Refill:  5      Procedures: No procedures performed   Clinical Data: No additional findings.   Subjective: Chief Complaint  Patient presents with  . Right Elbow - Pain    Patient is a 51 year old female comes in with right elbow pain for 4-5 weeks.  Denies any injuries.  It sort is worse with holding a cup of coffee and with typing.  She is right-hand dominant.  She endorses some mild numbness and pain is worse at night.    Review of Systems  Constitutional: Negative.   HENT: Negative.   Eyes: Negative.   Respiratory: Negative.   Cardiovascular: Negative.   Endocrine: Negative.   Musculoskeletal: Negative.   Neurological: Negative.   Hematological: Negative.   Psychiatric/Behavioral:  Negative.   All other systems reviewed and are negative.    Objective: Vital Signs: There were no vitals taken for this visit.  Physical Exam  Constitutional: She is oriented to person, place, and time. She appears well-developed and well-nourished.  HENT:  Head: Normocephalic and atraumatic.  Eyes: EOM are normal.  Neck: Neck supple.  Pulmonary/Chest: Effort normal.  Abdominal: Soft.  Neurological: She is alert and oriented to person, place, and time.  Skin: Skin is warm. Capillary refill takes less than 2 seconds.  Psychiatric: She has a normal mood and affect. Her behavior is normal. Judgment and thought content normal.  Nursing note and vitals reviewed.   Ortho Exam Right elbow exam shows tenderness of the lateral epicondyle and the extensor muscle mass.  Pain with resisted ECRB. Specialty Comments:  No specialty comments available.  Imaging: Xr Elbow 2 Views Right  Result Date: 06/20/2017 No acute bony or structural abnormalities    PMFS History: Patient Active Problem List   Diagnosis Date Noted  . Right lateral epicondylitis 06/20/2017  . Tobacco use disorder 08/11/2013   Past Medical History:  Diagnosis Date  . Chronic pain    neck-back  . Wears glasses  History reviewed. No pertinent family history.  Past Surgical History:  Procedure Laterality Date  . BREAST EXCISIONAL BIOPSY Left   . BREAST SURGERY     lt br bx-neg  . CERVICAL FUSION  2009   c4-5  . DILATION AND CURETTAGE OF UTERUS  2005  . MINOR FULGERATION OF ANAL CONDYLOMA  2004   Social History   Occupational History  . Not on file  Tobacco Use  . Smoking status: Current Every Day Smoker    Packs/day: 1.50  . Smokeless tobacco: Never Used  Substance and Sexual Activity  . Alcohol use: Yes  . Drug use: No  . Sexual activity: Not on file

## 2018-05-21 ENCOUNTER — Other Ambulatory Visit: Payer: Self-pay | Admitting: Obstetrics and Gynecology

## 2018-05-21 DIAGNOSIS — Z1231 Encounter for screening mammogram for malignant neoplasm of breast: Secondary | ICD-10-CM

## 2018-06-04 ENCOUNTER — Other Ambulatory Visit: Payer: Self-pay | Admitting: Obstetrics and Gynecology

## 2018-06-04 ENCOUNTER — Ambulatory Visit
Admission: RE | Admit: 2018-06-04 | Discharge: 2018-06-04 | Disposition: A | Discharge status: Home / Self Care | Payer: 59 | Source: Ambulatory Visit | Attending: Obstetrics and Gynecology | Admitting: Obstetrics and Gynecology

## 2018-06-04 DIAGNOSIS — E041 Nontoxic single thyroid nodule: Secondary | ICD-10-CM

## 2018-06-04 DIAGNOSIS — Z1231 Encounter for screening mammogram for malignant neoplasm of breast: Secondary | ICD-10-CM

## 2018-06-07 ENCOUNTER — Ambulatory Visit: Payer: 59

## 2018-07-02 ENCOUNTER — Ambulatory Visit: Payer: Self-pay | Admitting: Surgery

## 2018-07-02 NOTE — H&P (Signed)
CC: Self referred for 2nd opinion regarding possible condyloma in perianal region  HPI: [name] is a very pleasant 52yoF with known history of HPV who has followed with Dr. Dareen Sparks of gynecology. She is also been seen by one of my partners Dr. Dwain Sparks him did fulguration of anal warts and a excisional biopsy of the left breast lesion. She is seen approximately 1 year ago by Dr. gross in our office and offered excision/fulguration of perianal condyloma. She did not schedule her procedure and was lost to follow-up. She returns today for evaluation for possible removal and treatment. She has a multiyear history of perianal "wards" which have been relatively stable in size. She's had prior peroneal fulgurations by both Dr. Dwain Sparks and Dr. Dareen Sparks. She denies ever being told she had abnormal cells or cancer here. Today, she reports persistent but stable lesions in this region. She denies any significant change in size, bleeding, and stable discomfort related to them. She states she has had prior HIV testing many years ago and was told this was negative. She denies any history of anal receptive intercourse. She denies any active concerns on her vulva or with regards to her vagina. All the lesions of concern appeared to be perianal and on the margin just anterior to the perianal area and the perineum.  PMH: HPV-states her last cervical Pap smear was 3-4 weeks ago and she was told this was normal  PSH: Excisional biopsy of left breast lesion Fulguration of condyloma, 2014 Dr. Dwain Sparks  FHx: Denies FHx of malignancy  Social: Denies use of tobacco/EtOH/drugs  ROS: A comprehensive 10 system review of systems was completed with the patient and pertinent findings as noted above.  The patient is a 52 year old female.   Allergies Savannah Sparks(Savannah Sparks, [place]; 07/02/2018 11:43 AM) No Known Allergies [06/20/2017]: Allergies Reconciled   Medication History Savannah Sparks(Savannah Sparks, CMA; 07/02/2018  11:44 AM) Paxil (10MG  Tablet, Oral) Active. Opana ER (10MG  Tab 12HR Deter, Oral) Active. Medications Reconciled    Review of Systems Savannah Sparks(Savannah Mcclenny M. Menaal Russum MD; 07/02/2018 1:22 PM) General Not Present- Appetite Loss, Chills, Fatigue, Fever, Night Sweats, Weight Gain and Weight Loss. Skin Not Present- Change in Wart/Mole, Dryness, Hives, Jaundice, New Lesions, Non-Healing Wounds, Rash and Ulcer. HEENT Not Present- Earache, Hearing Loss, Hoarseness, Nose Bleed, Oral Ulcers, Ringing in the Ears, Seasonal Allergies, Sinus Pain, Sore Throat, Visual Disturbances, Wears glasses/contact lenses and Yellow Eyes. Respiratory Not Present- Bloody sputum, Chronic Cough, Difficulty Breathing, Snoring and Wheezing. Breast Not Present- Breast Mass, Breast Pain, Nipple Discharge and Skin Changes. Cardiovascular Not Present- Chest Pain, Difficulty Breathing Lying Down, Leg Cramps, Palpitations, Rapid Heart Rate, Shortness of Breath and Swelling of Extremities. Gastrointestinal Not Present- Abdominal Pain, Bloating, Bloody Stool, Change in Bowel Habits, Chronic diarrhea, Constipation, Difficulty Swallowing, Excessive gas, Gets full quickly at meals, Hemorrhoids, Indigestion, Nausea, Rectal Pain and Vomiting. Neurological Not Present- Decreased Memory and Difficulty Speaking. Psychiatric Not Present- Anxiety and Depression. Hematology Not Present- Blood Clots and Blood Thinners.  Vitals Savannah Sparks(Savannah Sparks CMA; 07/02/2018 11:43 AM) 07/02/2018 11:42 AM Weight: 172.13 lb Height: 62in Body Surface Area: 1.79 m Body Mass Index: 31.48 kg/m  Temp.: 98.51F  Pulse: 91 (Regular)  BP: 134/78 (Sitting, Left Arm, Standard)       Physical Exam Savannah Sparks(Savannah Monjaraz M. Anish Vana MD; 07/02/2018 1:27 PM) The physical exam findings are as follows: Note:Constitutional: No acute distress; conversant; no deformities Eyes: Moist conjunctiva; no lid lag; anicteric sclerae; pupils equal round and reactive to light Neck:  Trachea midline;  no palpable thyromegaly Lungs: Normal respiratory effort; no tactile fremitus CV: Regular rate and rhythm; no palpable thrill; no pitting edema GI: Abdomen soft, nontender, nondistended; no palpable hepatosplenomegaly Anorectal: Multiple flat lesions on the anterior perianal area. A couple other scattered flat lesions around the anus. No ulcerations. DRE-good tone, no palpable masses. Anoscopy: Somewhat limited visualization due to patient tolerance but no obvious lesions seen in the anal canal MSK: Normal gait; no clubbing/cyanosis Psychiatric: Appropriate affect; alert and oriented 3 Lymphatic: No palpable cervical or axillary lymphadenopathy **A chaperone, Savannah Sparks, was present for the entire physical exam    Assessment & Plan Savannah Sparks M. 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 is a very pelasant 52yoF with hx of perianal condyloma for many years, prior fulguration 2014 here with complaints of persistent lesions Impression: -The anatomy and physiology of the anal canal was discussed at length with the patient. The pathophysiology of anal warts, dysplasia and cancer was discussed at length with associated pictures and illustrations. -We discussed options of moving forward. She inquired whether or not there could be dysplastic cells or malignancy present. We discussed that without excision we cannot definitively say whether or not there is abnormal neoplastic cells present. She elected to undergo excision of the larger perianal areas with fulguration of the smaller areas. We discussed anorectal exam under anesthesia, fulguration, and excision of perianal lesions. We also discussed nonoperative options including observation and topical creams but that this would not rule out something more concerning. Given that these are flat, I believe excision+fulguration is the wisest decision. -The planned procedure, material risks (including,  but not limited to, pain, bleeding, infection, scarring, need for blood transfusion, damage to anal sphincter, incontinence of gas and/or stool, need for additional procedures, recurrence, pneumonia, heart attack, stroke, death) benefits and alternatives to surgery were discussed at length. The patient's questions were answered to her satisfaction, she voiced understanding and elected to proceed with surgery. Additionally, we discussed typical postoperative expectations and the recovery process -we discussed significant discomfort in the weeks following surgery. She is planning on taking 4 weeks off of work for this procedure. We discussed that the wounds would likely be left open to heal be secondary intention -We discussed retesting for HIV and will plan for this as part of her preop labs if possible -We also discussed the importance of moving forward of cervical surveillance with gynecology as she has been doing -She has never had a colonoscopy and I recommended she obtain one given age alone; we will place a referral to [facility] Current Plans ANOSCOPY, DIAGNOSTIC [phone]) (Anoscopy: Somewhat limited visualization due to patient tolerance but no obvious lesions seen in the anal canal) Signed electronically by Andria Meuse, MD (07/02/2018 1:30 PM)

## 2018-07-10 ENCOUNTER — Encounter (HOSPITAL_COMMUNITY): Payer: Self-pay | Admitting: Emergency Medicine

## 2018-07-10 ENCOUNTER — Ambulatory Visit (HOSPITAL_COMMUNITY)
Admission: EM | Admit: 2018-07-10 | Discharge: 2018-07-10 | Disposition: A | Discharge status: Home / Self Care | Payer: 59 | Attending: Family Medicine | Admitting: Family Medicine

## 2018-07-10 ENCOUNTER — Ambulatory Visit (INDEPENDENT_AMBULATORY_CARE_PROVIDER_SITE_OTHER): Payer: 59

## 2018-07-10 DIAGNOSIS — J181 Lobar pneumonia, unspecified organism: Secondary | ICD-10-CM | POA: Diagnosis not present

## 2018-07-10 DIAGNOSIS — R062 Wheezing: Secondary | ICD-10-CM

## 2018-07-10 DIAGNOSIS — R0602 Shortness of breath: Secondary | ICD-10-CM

## 2018-07-10 DIAGNOSIS — J189 Pneumonia, unspecified organism: Secondary | ICD-10-CM

## 2018-07-10 MED ORDER — IPRATROPIUM-ALBUTEROL 0.5-2.5 (3) MG/3ML IN SOLN
RESPIRATORY_TRACT | Status: AC
  Filled 2018-07-10: qty 3

## 2018-07-10 MED ORDER — ALBUTEROL SULFATE HFA 108 (90 BASE) MCG/ACT IN AERS
INHALATION_SPRAY | RESPIRATORY_TRACT | Status: AC
  Filled 2018-07-10: qty 6.7

## 2018-07-10 MED ORDER — ALBUTEROL SULFATE HFA 108 (90 BASE) MCG/ACT IN AERS
2.0000 | INHALATION_SPRAY | Freq: Once | RESPIRATORY_TRACT | Status: AC
  Administered 2018-07-10: 2 via RESPIRATORY_TRACT

## 2018-07-10 MED ORDER — BENZONATATE 100 MG PO CAPS: 100.0000 mg | ORAL_CAPSULE | Freq: Three times a day (TID) | ORAL | 0 refills | Status: DC

## 2018-07-10 MED ORDER — AZITHROMYCIN 250 MG PO TABS: 250.0000 mg | ORAL_TABLET | Freq: Every day | ORAL | 0 refills | Status: DC

## 2018-07-10 MED ORDER — IPRATROPIUM-ALBUTEROL 0.5-2.5 (3) MG/3ML IN SOLN
3.0000 mL | Freq: Once | RESPIRATORY_TRACT | Status: AC
  Administered 2018-07-10: 3 mL via RESPIRATORY_TRACT

## 2018-07-10 MED ORDER — IBUPROFEN 800 MG PO TABS
800.0000 mg | ORAL_TABLET | Freq: Once | ORAL | Status: AC
  Administered 2018-07-10: 800 mg via ORAL

## 2018-07-10 MED ORDER — IBUPROFEN 800 MG PO TABS
ORAL_TABLET | ORAL | Status: AC
  Filled 2018-07-10: qty 1

## 2018-07-10 MED ORDER — AMOXICILLIN-POT CLAVULANATE 875-125 MG PO TABS: 1.0000 | ORAL_TABLET | Freq: Two times a day (BID) | ORAL | 0 refills | Status: AC

## 2018-07-10 NOTE — ED Triage Notes (Signed)
Pt states shes been around her sister who had URI, states shes been feeling bad as well, cold chills, cough, congestion. Body aches. Since Friday.

## 2018-07-10 NOTE — Discharge Instructions (Addendum)
X-rays showed a pneumonia Breathing treatment given in office Get plenty of rest and push fluids Tessalon perles given as needed for cough Augmentin and azithromycin prescribed for pneumonia.  Take as directed and to completion.   Follow up with PCP in 1 week for recheck Return or go to ER if you have any new or worsening symptoms worsening cough, fever, fatigue, nausea, vomiting, chest pain, shortness of breath, etc...  Recommend repeat x-ray in 6 weeks to exclude underlying malignancy

## 2018-07-10 NOTE — ED Provider Notes (Signed)
[facility]   [id number] 07/10/18 Arrival Time: 1402   CC: URI symptoms   SUBJECTIVE: History from: Savannah Sparks.  Savannah Sparks is a 52 y.o. female hx significant for tobacco use (1 PPD x 33 years) who presents with persistent dry cough x 4 days ago.  Admits to positive sick exposure to sister with similar symptoms, diagnosed with PNA.  Has tried OTC medications with minimal relief.  Symptoms are made worse at night.  Denies previous symptoms in the past. Complains of subjective fever, chills, fatigue, wheezing.  Denies sinus pain, rhinorrhea, sore throat, SOB, chest pain, nausea, changes in bowel or bladder habits.    Received flu shot this year: no.  ROS: As per HPI.  Past Medical History:  Diagnosis Date  . Chronic pain    neck-back  . Wears glasses    Past Surgical History:  Procedure Laterality Date  . BREAST BIOPSY Left 07/11/2013   Procedure: BREAST BIOPSY WITH NEEDLE LOCALIZATION;  Surgeon: Emelia LoronMatthew Wakefield, MD;  Location: [facility];  Service: General;  Laterality: Left;  . BREAST EXCISIONAL BIOPSY Left   . BREAST SURGERY     lt br bx-neg  . CERVICAL FUSION  2009   c4-5  . DILATION AND CURETTAGE OF UTERUS  2005  . MINOR FULGERATION OF ANAL CONDYLOMA  2004  . WART FULGURATION N/A 07/11/2013   Procedure: FULGURATION ANAL WART WITH EXAM UNDER ANESTHESIA;  Surgeon: Emelia LoronMatthew Wakefield, MD;  Location: [facility];  Service: General;  Laterality: N/A;   No Known Allergies No current facility-administered medications on file prior to encounter.    Current Outpatient Medications on File Prior to Encounter  Medication Sig Dispense Refill  . oxymorphone (OPANA ER) 5 MG 12 hr tablet Take 5 mg by mouth every 12 (twelve) hours.     Social History   Socioeconomic History  . Marital status: Married    Spouse name: Not on file  . Number of children: Not on file  . Years of education: Not on file  . Highest education level: Not on file    Occupational History  . Not on file  Social Needs  . Financial resource strain: Not on file  . Food insecurity:    Worry: Not on file    Inability: Not on file  . Transportation needs:    Medical: Not on file    Non-medical: Not on file  Tobacco Use  . Smoking status: Current Every Day Smoker    Packs/day: 1.50  . Smokeless tobacco: Never Used  Substance and Sexual Activity  . Alcohol use: Yes  . Drug use: No  . Sexual activity: Not on file  Lifestyle  . Physical activity:    Days per week: Not on file    Minutes per session: Not on file  . Stress: Not on file  Relationships  . Social connections:    Talks on phone: Not on file    Gets together: Not on file    Attends religious service: Not on file    Active member of club or organization: Not on file    Attends meetings of clubs or organizations: Not on file    Relationship status: Not on file  . Intimate partner violence:    Fear of current or ex partner: Not on file    Emotionally abused: Not on file    Physically abused: Not on file    Forced sexual activity: Not on file  Other Topics Concern  .  Not on file  Social History Narrative  . Not on file   No family history on file.  OBJECTIVE:  Vitals:   07/10/18 1444  BP: 130/78  Pulse: 83  Resp: 18  Temp: 98.8 F (37.1 C)  SpO2: 98%     General appearance: alert; appears fatigued, but nontoxic; speaking in full sentences and tolerating own secretions HEENT: NCAT; Ears: EACs clear, TMs pearly gray; Eyes: PERRL.  EOM grossly intact. Nose: nares patent without rhinorrhea, Throat: oropharynx clear, tonsils non erythematous or enlarged, uvula midline  Neck: supple without LAD Lungs: unlabored respirations, symmetrical air entry; cough: moderate; no respiratory distress; diffuse wheezes and rhochi heard throughout bilateral lung fields, improved with breathing treatment Heart: regular rate and rhythm.  Radial pulses 2+ symmetrical bilaterally Skin: warm and  dry Psychological: alert and cooperative; normal mood and affect  DIAGNOSTIC STUDIES:  Dg Chest 2 View  Result Date: 07/10/2018 CLINICAL DATA:  52 y/o  F; 3 days of cough and fever. EXAM: CHEST - 2 VIEW COMPARISON:  10/10/2016 chest radiograph. FINDINGS: Stable normal cardiac silhouette given projection and technique. Left lung base consolidation. No pleural effusion or pneumothorax. ACDF hardware noted. Bones are otherwise unremarkable. IMPRESSION: Left lung base consolidation compatible with pneumonia. Electronically Signed   By: Mitzi Hansen M.D.   On: 07/10/2018 16:15    ASSESSMENT & PLAN:  1. Pneumonia of left lower lobe due to infectious organism [facility])     Meds ordered this encounter  Medications  . ibuprofen (ADVIL,MOTRIN) tablet 800 mg  . ipratropium-albuterol (DUONEB) 0.5-2.5 (3) MG/3ML nebulizer solution 3 mL  . amoxicillin-clavulanate (AUGMENTIN) 875-125 MG tablet    Sig: Take 1 tablet by mouth every 12 (twelve) hours for 10 days.    Dispense:  20 tablet    Refill:  0    Order Specific Question:   Supervising Provider    Answer:   Savannah Sparks [[id number]]  . azithromycin (ZITHROMAX) 250 MG tablet    Sig: Take 1 tablet (250 mg total) by mouth daily. Take first 2 tablets together, then 1 every day until finished.    Dispense:  6 tablet    Refill:  0    Order Specific Question:   Supervising Provider    Answer:   Savannah Sparks [[id number]]  . benzonatate (TESSALON) 100 MG capsule    Sig: Take 1 capsule (100 mg total) by mouth every 8 (eight) hours.    Dispense:  21 capsule    Refill:  0    Order Specific Question:   Supervising Provider    Answer:   Savannah Sparks [[id number]]  . albuterol (PROVENTIL HFA;VENTOLIN HFA) 108 (90 Base) MCG/ACT inhaler 2 puff   X-rays showed a pneumonia Inhaler given as needed for shortness of breath and/or wheezing Breathing treatment given in office Get plenty of rest and push fluids Tessalon perles given as needed  for cough Augmentin and azithromycin prescribed for pneumonia.  Take as directed and to completion.   Follow up with PCP in 1 week for recheck Return or go to ER if you have any new or worsening symptoms worsening cough, fever, fatigue, nausea, vomiting, chest pain, shortness of breath, etc...  Recommend repeat x-ray in 6 weeks to exclude underlying malignancy  Reviewed expectations re: course of current medical issues. Questions answered. Outlined signs and symptoms indicating need for more acute intervention. Savannah Sparks verbalized understanding. After Visit Summary given.         Rennis Harding, PA-C 07/10/18 [phone]

## 2018-07-24 ENCOUNTER — Ambulatory Visit (HOSPITAL_COMMUNITY)
Admission: EM | Admit: 2018-07-24 | Discharge: 2018-07-24 | Disposition: A | Discharge status: Home / Self Care | Payer: 59 | Attending: Family Medicine | Admitting: Family Medicine

## 2018-07-24 ENCOUNTER — Encounter (HOSPITAL_COMMUNITY): Payer: Self-pay

## 2018-07-24 ENCOUNTER — Ambulatory Visit (INDEPENDENT_AMBULATORY_CARE_PROVIDER_SITE_OTHER): Payer: 59

## 2018-07-24 ENCOUNTER — Telehealth (HOSPITAL_COMMUNITY): Payer: Self-pay | Admitting: Emergency Medicine

## 2018-07-24 DIAGNOSIS — R05 Cough: Secondary | ICD-10-CM | POA: Insufficient documentation

## 2018-07-24 DIAGNOSIS — R059 Cough, unspecified: Secondary | ICD-10-CM

## 2018-07-24 MED ORDER — HYDROCODONE-HOMATROPINE 5-1.5 MG/5ML PO SYRP: 5.0000 mL | ORAL_SOLUTION | Freq: Four times a day (QID) | ORAL | 0 refills | Status: DC | PRN

## 2018-07-24 MED ORDER — BENZONATATE 100 MG PO CAPS: 100.0000 mg | ORAL_CAPSULE | Freq: Three times a day (TID) | ORAL | 0 refills | Status: DC

## 2018-07-24 NOTE — ED Provider Notes (Signed)
[facility]    CSN: [id number] Arrival date & time: 07/24/18  1316     History   Chief Complaint Chief Complaint  Patient presents with  . Cough    HPI Savannah Sparks is a 52 y.o. female.   Patient is a 52 year old female presents for continuous cough that has been persistent for approximately 1 month.  She was seen here on 07/10/2018 and diagnosed with pneumonia.  She has finished the full course of antibiotics.  She feels less fatigued, the fever has subsided.  Her main complaint is persistent cough.  She is not currently taking any cough medication over-the-counter or that was prescribed.  She did try the [facility] but this did not relieve her cough.  She quit taking the NyQuil as soon as she started the antibiotics.  She denies any chest pain, shortness of breath.  ROS per HPI      Past Medical History:  Diagnosis Date  . Chronic pain    neck-back  . Wears glasses     Patient Active Problem List   Diagnosis Date Noted  . Right lateral epicondylitis 06/20/2017  . Tobacco use disorder 08/11/2013    Past Surgical History:  Procedure Laterality Date  . BREAST BIOPSY Left 07/11/2013   Procedure: BREAST BIOPSY WITH NEEDLE LOCALIZATION;  Surgeon: Emelia Loron, MD;  Location: [facility];  Service: General;  Laterality: Left;  . BREAST EXCISIONAL BIOPSY Left   . BREAST SURGERY     lt br bx-neg  . CERVICAL FUSION  2009   c4-5  . DILATION AND CURETTAGE OF UTERUS  2005  . MINOR FULGERATION OF ANAL CONDYLOMA  2004  . WART FULGURATION N/A 07/11/2013   Procedure: FULGURATION ANAL WART WITH EXAM UNDER ANESTHESIA;  Surgeon: Emelia Loron, MD;  Location: [facility];  Service: General;  Laterality: N/A;    OB History   No obstetric history on file.      Home Medications    Prior to Admission medications   Medication Sig Start Date End Date Taking? Authorizing Provider  azithromycin (ZITHROMAX) 250 MG tablet  Take 1 tablet (250 mg total) by mouth daily. Take first 2 tablets together, then 1 every day until finished. 07/10/18   Wurst, [place], PA-C  benzonatate (TESSALON) 100 MG capsule Take 1 capsule (100 mg total) by mouth every 8 (eight) hours. 07/10/18   Wurst, [place], PA-C  HYDROcodone-homatropine (HYCODAN) 5-1.5 MG/5ML syrup Take 5 mLs by mouth every 6 (six) hours as needed for cough. 07/24/18   Dahlia Byes A, NP  oxymorphone (OPANA ER) 5 MG 12 hr tablet Take 5 mg by mouth every 12 (twelve) hours.    [provider]    Family History History reviewed. No pertinent family history.  Social History Social History   Tobacco Use  . Smoking status: Current Every Day Smoker    Packs/day: 1.50  . Smokeless tobacco: Never Used  Substance Use Topics  . Alcohol use: Yes  . Drug use: No     Allergies   Patient has no known allergies.   Review of Systems Review of Systems   Physical Exam Triage Vital Signs ED Triage Vitals [07/24/18 1357]  Enc Vitals Group     BP 122/79     Pulse Rate 76     Resp 16     Temp 97.9 F (36.6 C)     Temp Source Oral     SpO2 100 %  Weight      Height      Head Circumference      Peak Flow      Pain Score      Pain Loc      Pain Edu?      Excl. in [place]?    No data found.  Updated Vital Signs BP 122/79 (BP Location: Right Arm)   Pulse 76   Temp 97.9 F (36.6 C) (Oral)   Resp 16   SpO2 100%   Visual Acuity Right Eye Distance:   Left Eye Distance:   Bilateral Distance:    Right Eye Near:   Left Eye Near:    Bilateral Near:     Physical Exam Vitals signs and nursing note reviewed.  Constitutional:      Appearance: Normal appearance.  HENT:     Head: Normocephalic and atraumatic.     Right Ear: Tympanic membrane, ear canal and external ear normal.     Left Ear: Tympanic membrane, ear canal and external ear normal.     Nose: Nose normal.  Eyes:     Conjunctiva/sclera: Conjunctivae normal.  Neck:     Musculoskeletal:  Normal range of motion.  Cardiovascular:     Rate and Rhythm: Normal rate and regular rhythm.     Heart sounds: Normal heart sounds.  Pulmonary:     Effort: Pulmonary effort is normal.     Breath sounds: Normal breath sounds.  Musculoskeletal: Normal range of motion.  Lymphadenopathy:     Cervical: No cervical adenopathy.  Skin:    General: Skin is warm and dry.  Neurological:     General: No focal deficit present.     Mental Status: She is alert.  Psychiatric:        Mood and Affect: Mood normal.      UC Treatments / Results  Labs (all labs ordered are listed, but only abnormal results are displayed) Labs Reviewed - No data to display  EKG None  Radiology Dg Chest 2 View  Result Date: 07/24/2018 CLINICAL DATA:  History of recent pneumonia, follow-up, smoking history EXAM: CHEST - 2 VIEW COMPARISON:  Chest x-ray of 07/10/2017 FINDINGS: The left lower lobe pneumonia has almost completely cleared with minimally prominent markings remaining on the frontal projection overlying the heart border. The right lung is clear. No pleural effusion is seen. Mediastinal and hilar contours are unremarkable and the heart is within normal limits in size. No acute bony abnormality is seen. IMPRESSION: Almost complete resolution of left lower lobe pneumonia with minimally prominent markings remaining medially at the left lung base overlying the heart border. Electronically Signed   By: Dwyane DeePaul  Barry M.D.   On: 07/24/2018 14:28    Procedures Procedures (including critical care time)  Medications Ordered in UC Medications - No data to display  Initial Impression / Assessment and Plan / UC Course  I have reviewed the triage vital signs and the nursing notes.  Pertinent labs & imaging results that were available during my care of the patient were reviewed by me and considered in my medical decision making (see chart for details).     X-ray revealed almost complete resolution of pneumonia Most  the patient symptoms have resolved besides cough Patient has been taking Tessalon Perles for cough with no relief. Will send Hycodan to help with the cough Follow up as needed for continued or worsening symptoms  Final Clinical Impressions(s) / UC Diagnoses   Final diagnoses:  Cough  Discharge Instructions     The pneumonia has almost cleared No need for any more antibiotics at this time I will prescribe you the stronger for cough to help you rest Follow up as needed for continued or worsening symptoms     ED Prescriptions    Medication Sig Dispense Auth. Provider   HYDROcodone-homatropine (HYCODAN) 5-1.5 MG/5ML syrup Take 5 mLs by mouth every 6 (six) hours as needed for cough. 120 mL Dahlia Byes A, NP     Controlled Substance Prescriptions [place] Controlled Substance Registry consulted? Not Applicable   Janace Aris, NP 07/24/18 1454

## 2018-07-24 NOTE — Discharge Instructions (Signed)
The pneumonia has almost cleared No need for any more antibiotics at this time I will prescribe you the stronger for cough to help you rest Follow up as needed for continued or worsening symptoms

## 2018-07-24 NOTE — Telephone Encounter (Signed)
Patient called and left VM stating she would like more tessalon pearls, okay to send to pharmacy per Traci NP, sent medicine, calle and left VM for patient to call back.

## 2018-07-24 NOTE — ED Triage Notes (Signed)
Pt present a non productive cough that has been going on for almost a month.

## 2019-07-01 ENCOUNTER — Ambulatory Visit
Admission: EM | Admit: 2019-07-01 | Discharge: 2019-07-01 | Disposition: A | Discharge status: Home / Self Care | Payer: Self-pay

## 2019-07-01 ENCOUNTER — Encounter: Payer: Self-pay | Admitting: Physician Assistant

## 2019-07-01 ENCOUNTER — Ambulatory Visit
Admission: EM | Admit: 2019-07-01 | Discharge: 2019-07-01 | Disposition: A | Discharge status: Home / Self Care | Payer: 59 | Attending: Physician Assistant | Admitting: Physician Assistant

## 2019-07-01 DIAGNOSIS — J209 Acute bronchitis, unspecified: Secondary | ICD-10-CM | POA: Diagnosis not present

## 2019-07-01 DIAGNOSIS — Z20828 Contact with and (suspected) exposure to other viral communicable diseases: Secondary | ICD-10-CM | POA: Diagnosis not present

## 2019-07-01 MED ORDER — ALBUTEROL SULFATE HFA 108 (90 BASE) MCG/ACT IN AERS: 1.0000 | INHALATION_SPRAY | Freq: Four times a day (QID) | RESPIRATORY_TRACT | 0 refills | Status: DC | PRN

## 2019-07-01 MED ORDER — DOXYCYCLINE HYCLATE 100 MG PO CAPS: 100.0000 mg | ORAL_CAPSULE | Freq: Two times a day (BID) | ORAL | 0 refills | Status: DC

## 2019-07-01 MED ORDER — PREDNISONE 50 MG PO TABS: 50.0000 mg | ORAL_TABLET | Freq: Every day | ORAL | 0 refills | Status: DC

## 2019-07-01 NOTE — ED Provider Notes (Signed)
[name]    CSN: [id number] Arrival date & time: 07/01/19  1202      History   Chief Complaint Chief Complaint  Patient presents with  . Cough    HPI Savannah Sparks is a 53 y.o. female.   53 year old female comes in for 1-2 week history of URI symptoms. Has had cough that  Can be productive. Denies rhinorrhea, nasal congestion, sore throat. Denies fever, chills, body aches. Denies abdominal pain, nausea, vomiting, diarrhea. Denies shortness of breath, loss of taste/smell. Current every day smoker, >30 pack year history. Husband with similar symptoms, negative COVID.      Past Medical History:  Diagnosis Date  . Chronic pain    neck-back  . Wears glasses     Patient Active Problem List   Diagnosis Date Noted  . Right lateral epicondylitis 06/20/2017  . Tobacco use disorder 08/11/2013    Past Surgical History:  Procedure Laterality Date  . BREAST BIOPSY Left 07/11/2013   Procedure: BREAST BIOPSY WITH NEEDLE LOCALIZATION;  Surgeon: Emelia Loron, MD;  Location: [facility];  Service: General;  Laterality: Left;  . BREAST EXCISIONAL BIOPSY Left   . BREAST SURGERY     lt br bx-neg  . CERVICAL FUSION  2009   c4-5  . DILATION AND CURETTAGE OF UTERUS  2005  . MINOR FULGERATION OF ANAL CONDYLOMA  2004  . WART FULGURATION N/A 07/11/2013   Procedure: FULGURATION ANAL WART WITH EXAM UNDER ANESTHESIA;  Surgeon: Emelia Loron, MD;  Location: [facility];  Service: General;  Laterality: N/A;    OB History   No obstetric history on file.      Home Medications    Prior to Admission medications   Medication Sig Start Date End Date Taking? Authorizing Provider  albuterol (VENTOLIN HFA) 108 (90 Base) MCG/ACT inhaler Inhale 1-2 puffs into the lungs every 6 (six) hours as needed for wheezing or shortness of breath. 07/01/19   Cathie Hoops, Amy V, PA-C  doxycycline (VIBRAMYCIN) 100 MG capsule Take 1 capsule (100 mg total) by mouth 2  (two) times daily. 07/01/19   Cathie Hoops, Amy V, PA-C  oxymorphone (OPANA ER) 5 MG 12 hr tablet Take 5 mg by mouth every 12 (twelve) hours.    [provider]  predniSONE (DELTASONE) 50 MG tablet Take 1 tablet (50 mg total) by mouth daily with breakfast. 07/01/19   Belinda Fisher, PA-C    Family History History reviewed. No pertinent family history.  Social History Social History   Tobacco Use  . Smoking status: Current Every Day Smoker    Packs/day: 1.50  . Smokeless tobacco: Never Used  Substance Use Topics  . Alcohol use: Yes  . Drug use: No     Allergies   Patient has no known allergies.   Review of Systems Review of Systems  Reason unable to perform ROS: See HPI as above.     Physical Exam Triage Vital Signs ED Triage Vitals  Enc Vitals Group     BP      Pulse      Resp      Temp      Temp src      SpO2      Weight      Height      Head Circumference      Peak Flow      Pain Score      Pain Loc      Pain  Edu?      Excl. in [place]?    No data found.  Updated Vital Signs BP (!) 147/82 (BP Location: Left Arm)   Pulse 73   Temp 98.4 F (36.9 C)   Resp 16   SpO2 96%   Physical Exam Constitutional:      General: She is not in acute distress.    Appearance: Normal appearance. She is not ill-appearing, toxic-appearing or diaphoretic.  HENT:     Head: Normocephalic and atraumatic.     Mouth/Throat:     Mouth: Mucous membranes are moist.     Pharynx: Oropharynx is clear. Uvula midline.  Neck:     Musculoskeletal: Normal range of motion and neck supple.  Cardiovascular:     Rate and Rhythm: Normal rate and regular rhythm.     Heart sounds: Normal heart sounds. No murmur. No friction rub. No gallop.   Pulmonary:     Effort: Pulmonary effort is normal. No accessory muscle usage, prolonged expiration, respiratory distress or retractions.     Comments: Lungs clear to auscultation without adventitious lung sounds. Neurological:     General: No focal deficit  present.     Mental Status: She is alert and oriented to person, place, and time.    UC Treatments / Results  Labs (all labs ordered are listed, but only abnormal results are displayed) Labs Reviewed  NOVEL CORONAVIRUS, NAA    EKG   Radiology No results found.  Procedures Procedures (including critical care time)  Medications Ordered in UC Medications - No data to display  Initial Impression / Assessment and Plan / UC Course  I have reviewed the triage vital signs and the nursing notes.  Pertinent labs & imaging results that were available during my care of the patient were reviewed by me and considered in my medical decision making (see chart for details).    COVID testing ordered. Patient to quarantine until testing results return. No alarming signs on exam.  Patient speaking in full sentences without respiratory distress. Will treat for bronchitis with prednisone and oxycycline. Albuterol as needed. Symptomatic treatment discussed.  Push fluids.  Return precautions given.  Patient expresses understanding and agrees to plan.  Final Clinical Impressions(s) / UC Diagnoses   Final diagnoses:  Acute bronchitis, unspecified organism   ED Prescriptions    Medication Sig Dispense Auth. Provider   predniSONE (DELTASONE) 50 MG tablet Take 1 tablet (50 mg total) by mouth daily with breakfast. 5 tablet Yu, Amy V, PA-C   doxycycline (VIBRAMYCIN) 100 MG capsule Take 1 capsule (100 mg total) by mouth 2 (two) times daily. 14 capsule Yu, Amy V, PA-C   albuterol (VENTOLIN HFA) 108 (90 Base) MCG/ACT inhaler Inhale 1-2 puffs into the lungs every 6 (six) hours as needed for wheezing or shortness of breath. 8 g Ok Edwards, PA-C     PDMP not reviewed this encounter.   Ok Edwards, PA-C 07/01/19 1349

## 2019-07-01 NOTE — Discharge Instructions (Signed)
COVID testing ordered. I would like you to quarantine until testing results. Start prednisone and doxycycline as directed. Albuterol as needed. If experiencing shortness of breath, trouble breathing, go to the emergency department for further evaluation needed.

## 2019-07-01 NOTE — ED Triage Notes (Signed)
Pt c/o cough. Pt seen by provider prior to triage.

## 2019-07-02 ENCOUNTER — Other Ambulatory Visit: Payer: Self-pay | Admitting: Obstetrics and Gynecology

## 2019-07-02 DIAGNOSIS — Z1231 Encounter for screening mammogram for malignant neoplasm of breast: Secondary | ICD-10-CM

## 2019-07-03 LAB — NOVEL CORONAVIRUS, NAA: SARS-CoV-2, NAA: NOT DETECTED

## 2019-07-09 ENCOUNTER — Other Ambulatory Visit: Payer: Self-pay

## 2019-07-09 ENCOUNTER — Ambulatory Visit
Admission: RE | Admit: 2019-07-09 | Discharge: 2019-07-09 | Disposition: A | Discharge status: Home / Self Care | Payer: 59 | Source: Ambulatory Visit | Attending: Obstetrics and Gynecology | Admitting: Obstetrics and Gynecology

## 2019-07-09 DIAGNOSIS — Z1231 Encounter for screening mammogram for malignant neoplasm of breast: Secondary | ICD-10-CM

## 2019-07-10 ENCOUNTER — Ambulatory Visit: Payer: 59

## 2020-06-29 ENCOUNTER — Encounter: Payer: Self-pay | Admitting: Emergency Medicine

## 2020-06-29 ENCOUNTER — Ambulatory Visit
Admission: EM | Admit: 2020-06-29 | Discharge: 2020-06-29 | Disposition: A | Discharge status: Home / Self Care | Payer: BC Managed Care – PPO | Attending: Emergency Medicine | Admitting: Emergency Medicine

## 2020-06-29 DIAGNOSIS — J209 Acute bronchitis, unspecified: Secondary | ICD-10-CM | POA: Diagnosis not present

## 2020-06-29 DIAGNOSIS — Z20822 Contact with and (suspected) exposure to covid-19: Secondary | ICD-10-CM

## 2020-06-29 MED ORDER — PREDNISONE 50 MG PO TABS: 50.0000 mg | ORAL_TABLET | Freq: Every day | ORAL | 0 refills | Status: DC

## 2020-06-29 MED ORDER — BENZONATATE 100 MG PO CAPS: 100.0000 mg | ORAL_CAPSULE | Freq: Three times a day (TID) | ORAL | 0 refills | Status: DC

## 2020-06-29 NOTE — ED Triage Notes (Signed)
Pt c/o productive cough with yellow sputum, nasal/chest congestion, fatigue, and chills since last Thursday. States hx of bronchitis and this feels the same.

## 2020-06-29 NOTE — ED Provider Notes (Signed)
[name]    CSN: [id number] Arrival date & time: 06/29/20  0815      History   Chief Complaint Chief Complaint  Patient presents with  . Cough    HPI Savannah Sparks is a 54 y.o. female  Savannah Sparks is a 54 y.o. female here for evaluation of a cough.  The cough is without wheezing, dyspnea or hemoptysis, productive of green/yellow sputum and is aggravated by dust and fumes. Onset of symptoms was 4 days ago, gradually worsening since that time.  Associated symptoms include sputum production. Patient does not have a history of asthma. Patient has not had recent travel. Patient does have a history of smoking. Patient  has not had a previous chest x-ray. Patient has not had a PPD done. The following portions of the patient's history were reviewed and updated as appropriate: allergies, current medications, past family history, past medical history, past social history, past surgical history and problem list.     Past Medical History:  Diagnosis Date  . Chronic pain    neck-back  . Wears glasses     Patient Active Problem List   Diagnosis Date Noted  . Right lateral epicondylitis 06/20/2017  . Tobacco use disorder 08/11/2013    Past Surgical History:  Procedure Laterality Date  . BREAST BIOPSY Left 07/11/2013   Procedure: BREAST BIOPSY WITH NEEDLE LOCALIZATION;  Surgeon: Emelia Loron, MD;  Location: [facility];  Service: General;  Laterality: Left;  . BREAST EXCISIONAL BIOPSY Left   . BREAST SURGERY     lt br bx-neg  . CERVICAL FUSION  2009   c4-5  . DILATION AND CURETTAGE OF UTERUS  2005  . MINOR FULGERATION OF ANAL CONDYLOMA  2004  . WART FULGURATION N/A 07/11/2013   Procedure: FULGURATION ANAL WART WITH EXAM UNDER ANESTHESIA;  Surgeon: Emelia Loron, MD;  Location: [facility];  Service: General;  Laterality: N/A;    OB History   No obstetric history on file.      Home Medications    Prior to  Admission medications   Medication Sig Start Date End Date Taking? Authorizing Provider  benzonatate (TESSALON) 100 MG capsule Take 1 capsule (100 mg total) by mouth every 8 (eight) hours. 06/29/20   Hall-Potvin, [place], PA-C  predniSONE (DELTASONE) 50 MG tablet Take 1 tablet (50 mg total) by mouth daily with breakfast. 06/29/20   Hall-Potvin, [place], PA-C    Family History History reviewed. No pertinent family history.  Social History Social History   Tobacco Use  . Smoking status: Current Every Day Smoker    Packs/day: 1.50  . Smokeless tobacco: Never Used  Substance Use Topics  . Alcohol use: Yes  . Drug use: No     Allergies   Patient has no known allergies.   Review of Systems Review of Systems  Constitutional: Positive for fever. Negative for fatigue.       Subjective  HENT: Positive for congestion. Negative for ear pain, sinus pain, sore throat and voice change.   Eyes: Negative for pain, redness and visual disturbance.  Respiratory: Positive for cough. Negative for shortness of breath and wheezing.   Cardiovascular: Negative for chest pain and palpitations.  Gastrointestinal: Negative for abdominal pain, diarrhea and vomiting.  Musculoskeletal: Negative for arthralgias and myalgias.  Skin: Negative for rash and wound.  Neurological: Negative for syncope and headaches.     Physical Exam Triage Vital Signs ED Triage Vitals  Enc Vitals Group  BP 06/29/20 0845 (!) 148/83     Pulse Rate 06/29/20 0845 76     Resp 06/29/20 0845 18     Temp 06/29/20 0845 98.4 F (36.9 C)     Temp Source 06/29/20 0845 Oral     SpO2 06/29/20 0845 96 %     Weight --      Height --      Head Circumference --      Peak Flow --      Pain Score 06/29/20 0846 7     Pain Loc --      Pain Edu? --      Excl. in [place]? --    No data found.  Updated Vital Signs BP (!) 148/83 (BP Location: Left Arm)   Pulse 76   Temp 98.4 F (36.9 C) (Oral)   Resp 18   LMP 06/22/2013   SpO2  96%   Visual Acuity Right Eye Distance:   Left Eye Distance:   Bilateral Distance:    Right Eye Near:   Left Eye Near:    Bilateral Near:     Physical Exam Constitutional:      General: She is not in acute distress.    Appearance: She is not ill-appearing or diaphoretic.  HENT:     Head: Normocephalic and atraumatic.     Mouth/Throat:     Mouth: Mucous membranes are moist.     Pharynx: Oropharynx is clear. No oropharyngeal exudate or posterior oropharyngeal erythema.  Eyes:     General: No scleral icterus.    Conjunctiva/sclera: Conjunctivae normal.     Pupils: Pupils are equal, round, and reactive to light.  Neck:     Comments: Trachea midline, negative JVD Cardiovascular:     Rate and Rhythm: Normal rate and regular rhythm.     Heart sounds: No murmur heard.  No gallop.   Pulmonary:     Effort: Pulmonary effort is normal. No respiratory distress.     Breath sounds: Wheezing and rhonchi present. No rales.     Comments: Mild, scattered.  Negative egophony Musculoskeletal:     Cervical back: Neck supple. No tenderness.  Lymphadenopathy:     Cervical: No cervical adenopathy.  Skin:    Capillary Refill: Capillary refill takes less than 2 seconds.     Coloration: Skin is not jaundiced or pale.     Findings: No rash.  Neurological:     General: No focal deficit present.     Mental Status: She is alert and oriented to person, place, and time.      UC Treatments / Results  Labs (all labs ordered are listed, but only abnormal results are displayed) Labs Reviewed  NOVEL CORONAVIRUS, NAA    EKG   Radiology No results found.  Procedures Procedures (including critical care time)  Medications Ordered in UC Medications - No data to display  Initial Impression / Assessment and Plan / UC Course  I have reviewed the triage vital signs and the nursing notes.  Pertinent labs & imaging results that were available during my care of the patient were reviewed by me and  considered in my medical decision making (see chart for details).     Patient afebrile, nontoxic, with SpO2 96%.  Covid PCR pending.  Patient to quarantine until results are back.  We will treat supportively as outlined below for bronchitis.  No h/o hospital admission for respiratory illness.  Return precautions discussed, patient verbalized understanding and is agreeable to plan. Final  Clinical Impressions(s) / UC Diagnoses   Final diagnoses:  Encounter for screening laboratory testing for COVID-19 virus  Acute bronchitis, unspecified organism     Discharge Instructions     Tessalon for cough. Start flonase, atrovent nasal spray for nasal congestion/drainage. You can use over the counter nasal saline rinse such as neti pot for nasal congestion. Keep hydrated, your urine should be clear to pale yellow in color. Tylenol/motrin for fever and pain. Monitor for any worsening of symptoms, chest pain, shortness of breath, wheezing, swelling of the throat, go to the emergency department for further evaluation needed.     ED Prescriptions    Medication Sig Dispense Auth. Provider   predniSONE (DELTASONE) 50 MG tablet Take 1 tablet (50 mg total) by mouth daily with breakfast. 5 tablet Hall-Potvin, [place], PA-C   benzonatate (TESSALON) 100 MG capsule Take 1 capsule (100 mg total) by mouth every 8 (eight) hours. 21 capsule Hall-Potvin, [place], PA-C     PDMP not reviewed this encounter.   Hall-Potvin, [place], [place] 06/29/20 [phone]

## 2020-06-29 NOTE — Discharge Instructions (Signed)

## 2020-06-30 LAB — SARS-COV-2, NAA 2 DAY TAT

## 2020-06-30 LAB — NOVEL CORONAVIRUS, NAA: SARS-CoV-2, NAA: NOT DETECTED

## 2020-07-03 ENCOUNTER — Ambulatory Visit (INDEPENDENT_AMBULATORY_CARE_PROVIDER_SITE_OTHER): Payer: BC Managed Care – PPO

## 2020-07-03 ENCOUNTER — Ambulatory Visit
Admission: EM | Admit: 2020-07-03 | Discharge: 2020-07-03 | Disposition: A | Discharge status: Home / Self Care | Payer: BC Managed Care – PPO | Attending: Internal Medicine | Admitting: Internal Medicine

## 2020-07-03 DIAGNOSIS — R059 Cough, unspecified: Secondary | ICD-10-CM | POA: Diagnosis not present

## 2020-07-03 DIAGNOSIS — J209 Acute bronchitis, unspecified: Secondary | ICD-10-CM | POA: Diagnosis not present

## 2020-07-03 MED ORDER — BENZONATATE 100 MG PO CAPS: 100.0000 mg | ORAL_CAPSULE | Freq: Three times a day (TID) | ORAL | 0 refills | Status: DC

## 2020-07-03 MED ORDER — PREDNISONE 50 MG PO TABS: 50.0000 mg | ORAL_TABLET | Freq: Every day | ORAL | 0 refills | Status: DC

## 2020-07-03 NOTE — ED Provider Notes (Signed)
[name]    CSN: [id number] Arrival date & time: 07/03/20  1443      History   Chief Complaint Chief Complaint  Patient presents with  . Medication Refill    HPI Savannah Sparks is a 54 y.o. female  Presenting for persistent cough.  Patient last seen for this by me on Monday: Given prednisone, benzonatate.  States that these medications help, though "did not quite take it yet ".  Patient has continued to smoke, despite counseling thereof.  States she is decreased, she is smoking however.  No shortness of breath, chest pain.  States that she typically "has to go to the doctors twice to get it fixed ".  Denies fever.  Past Medical History:  Diagnosis Date  . Chronic pain    neck-back  . Wears glasses     Patient Active Problem List   Diagnosis Date Noted  . Right lateral epicondylitis 06/20/2017  . Tobacco use disorder 08/11/2013    Past Surgical History:  Procedure Laterality Date  . BREAST BIOPSY Left 07/11/2013   Procedure: BREAST BIOPSY WITH NEEDLE LOCALIZATION;  Surgeon: Emelia Loron, MD;  Location: [facility];  Service: General;  Laterality: Left;  . BREAST EXCISIONAL BIOPSY Left   . BREAST SURGERY     lt br bx-neg  . CERVICAL FUSION  2009   c4-5  . DILATION AND CURETTAGE OF UTERUS  2005  . MINOR FULGERATION OF ANAL CONDYLOMA  2004  . WART FULGURATION N/A 07/11/2013   Procedure: FULGURATION ANAL WART WITH EXAM UNDER ANESTHESIA;  Surgeon: Emelia Loron, MD;  Location: [facility];  Service: General;  Laterality: N/A;    OB History   No obstetric history on file.      Home Medications    Prior to Admission medications   Medication Sig Start Date End Date Taking? Authorizing Provider  benzonatate (TESSALON) 100 MG capsule Take 1 capsule (100 mg total) by mouth every 8 (eight) hours. 07/03/20   Hall-Potvin, [place], PA-C  predniSONE (DELTASONE) 50 MG tablet Take 1 tablet (50 mg total) by mouth daily  with breakfast. 07/03/20   Hall-Potvin, [place], PA-C    Family History History reviewed. No pertinent family history.  Social History Social History   Tobacco Use  . Smoking status: Current Every Day Smoker    Packs/day: 1.50  . Smokeless tobacco: Never Used  Substance Use Topics  . Alcohol use: Yes  . Drug use: No     Allergies   Patient has no known allergies.   Review of Systems Review of Systems  Constitutional: Positive for fatigue. Negative for fever.  HENT: Negative for congestion, dental problem, ear pain, facial swelling, hearing loss, sinus pain, sore throat, trouble swallowing and voice change.   Eyes: Negative for photophobia, pain, redness and visual disturbance.  Respiratory: Positive for cough. Negative for shortness of breath.   Cardiovascular: Negative for chest pain and palpitations.  Gastrointestinal: Negative for abdominal pain, diarrhea and vomiting.  Musculoskeletal: Negative for arthralgias and myalgias.  Skin: Negative for rash and wound.  Neurological: Negative for dizziness, syncope and headaches.     Physical Exam Triage Vital Signs ED Triage Vitals  Enc Vitals Group     BP 07/03/20 1524 136/79     Pulse Rate 07/03/20 1524 72     Resp 07/03/20 1524 18     Temp 07/03/20 1524 97.8 F (36.6 C)     Temp Source 07/03/20 1524 Oral  SpO2 07/03/20 1524 95 %     Weight --      Height --      Head Circumference --      Peak Flow --      Pain Score 07/03/20 1525 0     Pain Loc --      Pain Edu? --      Excl. in GC? --    No data found.  Updated Vital Signs BP 136/79 (BP Location: Left Arm)   Pulse 72   Temp 97.8 F (36.6 C) (Oral)   Resp 18   LMP 06/22/2013   SpO2 95%   Visual Acuity Right Eye Distance:   Left Eye Distance:   Bilateral Distance:    Right Eye Near:   Left Eye Near:    Bilateral Near:     Physical Exam Constitutional:      General: She is not in acute distress.    Appearance: She is not ill-appearing  or diaphoretic.  HENT:     Head: Normocephalic and atraumatic.     Right Ear: Tympanic membrane and ear canal normal.     Left Ear: Tympanic membrane and ear canal normal.     Mouth/Throat:     Mouth: Mucous membranes are moist.     Pharynx: Oropharynx is clear. No oropharyngeal exudate or posterior oropharyngeal erythema.  Eyes:     General: No scleral icterus.    Conjunctiva/sclera: Conjunctivae normal.     Pupils: Pupils are equal, round, and reactive to light.  Neck:     Comments: Trachea midline, negative JVD Cardiovascular:     Rate and Rhythm: Normal rate and regular rhythm.     Heart sounds: No murmur heard.  No gallop.   Pulmonary:     Effort: Pulmonary effort is normal. No respiratory distress.     Breath sounds: No wheezing, rhonchi or rales.     Comments: Improved from last visit Musculoskeletal:     Cervical back: Neck supple. No tenderness.  Lymphadenopathy:     Cervical: No cervical adenopathy.  Skin:    Capillary Refill: Capillary refill takes less than 2 seconds.     Coloration: Skin is not jaundiced or pale.     Findings: No rash.  Neurological:     General: No focal deficit present.     Mental Status: She is alert and oriented to person, place, and time.      UC Treatments / Results  Labs (all labs ordered are listed, but only abnormal results are displayed) Labs Reviewed - No data to display  EKG   Radiology DG Chest 2 View  Result Date: 07/03/2020 CLINICAL DATA:  Cough EXAM: CHEST - 2 VIEW COMPARISON:  07/24/2018 FINDINGS: Hardware in the cervical spine. No consolidation or effusion. Mild diffuse bronchitic changes. Normal heart size. No pneumothorax. IMPRESSION: Mild diffuse bronchitic changes. No focal pulmonary infiltrate. Electronically Signed   By: Jasmine Pang M.D.   On: 07/03/2020 16:19    Procedures Procedures (including critical care time)  Medications Ordered in UC Medications - No data to display  Initial Impression /  Assessment and Plan / UC Course  I have reviewed the triage vital signs and the nursing notes.  Pertinent labs & imaging results that were available during my care of the patient were reviewed by me and considered in my medical decision making (see chart for details).     X-ray with mild, diffuse bronchitic changes.  No pulmonary infiltrate.  Will extend prednisone,  benzonatate x5 days, have patient follow-up with PCP thereafter.  Return precautions discussed, pt verbalized understanding and is agreeable to plan. Final Clinical Impressions(s) / UC Diagnoses   Final diagnoses:  Acute bronchitis, unspecified organism   Discharge Instructions   None    ED Prescriptions    Medication Sig Dispense Auth. Provider   benzonatate (TESSALON) 100 MG capsule Take 1 capsule (100 mg total) by mouth every 8 (eight) hours. 21 capsule Hall-Potvin, [place], PA-C   predniSONE (DELTASONE) 50 MG tablet Take 1 tablet (50 mg total) by mouth daily with breakfast. 5 tablet Hall-Potvin, [place], PA-C     PDMP not reviewed this encounter.   Hall-Potvin, [place], [place] 07/03/20 [phone]

## 2020-07-03 NOTE — ED Triage Notes (Addendum)
Pt present medication refill on prednisone. Pt states she was just here on Monday and have completed the course of medication but symptoms has not gotten any better pretty much the same.

## 2020-07-09 ENCOUNTER — Encounter (HOSPITAL_COMMUNITY): Payer: Self-pay

## 2020-07-09 ENCOUNTER — Ambulatory Visit (HOSPITAL_COMMUNITY)
Admission: EM | Admit: 2020-07-09 | Discharge: 2020-07-09 | Disposition: A | Discharge status: Home / Self Care | Payer: BC Managed Care – PPO | Attending: Family Medicine | Admitting: Family Medicine

## 2020-07-09 ENCOUNTER — Other Ambulatory Visit: Payer: Self-pay

## 2020-07-09 DIAGNOSIS — R053 Chronic cough: Secondary | ICD-10-CM

## 2020-07-09 DIAGNOSIS — J209 Acute bronchitis, unspecified: Secondary | ICD-10-CM

## 2020-07-09 MED ORDER — BENZONATATE 100 MG PO CAPS: 200.0000 mg | ORAL_CAPSULE | Freq: Three times a day (TID) | ORAL | 0 refills | Status: DC | PRN

## 2020-07-09 MED ORDER — PROMETHAZINE-CODEINE 6.25-10 MG/5ML PO SYRP: 5.0000 mL | ORAL_SOLUTION | Freq: Every day | ORAL | 0 refills | Status: DC

## 2020-07-09 MED ORDER — ALBUTEROL SULFATE HFA 108 (90 BASE) MCG/ACT IN AERS
2.0000 | INHALATION_SPRAY | Freq: Once | RESPIRATORY_TRACT | Status: AC
  Administered 2020-07-09: 2 via RESPIRATORY_TRACT

## 2020-07-09 MED ORDER — ALBUTEROL SULFATE HFA 108 (90 BASE) MCG/ACT IN AERS
INHALATION_SPRAY | RESPIRATORY_TRACT | Status: AC
  Filled 2020-07-09: qty 6.7

## 2020-07-09 NOTE — ED Provider Notes (Signed)
[facility]    CSN: [id number] Arrival date & time: 07/09/20  7893      History   Chief Complaint Chief Complaint  Patient presents with  . Follow-up    HPI Savannah Sparks is a 54 y.o. female.   HPI  Patient presents today for evaluation of persistent cough following recent diagnosis of bronchitis.  Patient is a current daily smoker.  Was treated with 50 mg of prednisone x5 days on 07/03/20.  Reports that cough has not improved.  Shortness of breath has improved.  She has completed all of prednisone.  Major concern is fatigue and persistent nonproductive cough.  She has a distant history of pneumonia however has not had any fever with current illness.  She had a negative Covid test on 06/29/2020  Past Medical History:  Diagnosis Date  . Chronic pain    neck-back  . Wears glasses     Patient Active Problem List   Diagnosis Date Noted  . Right lateral epicondylitis 06/20/2017  . Tobacco use disorder 08/11/2013    Past Surgical History:  Procedure Laterality Date  . BREAST BIOPSY Left 07/11/2013   Procedure: BREAST BIOPSY WITH NEEDLE LOCALIZATION;  Surgeon: Emelia Loron, MD;  Location: [facility];  Service: General;  Laterality: Left;  . BREAST EXCISIONAL BIOPSY Left   . BREAST SURGERY     lt br bx-neg  . CERVICAL FUSION  2009   c4-5  . DILATION AND CURETTAGE OF UTERUS  2005  . MINOR FULGERATION OF ANAL CONDYLOMA  2004  . WART FULGURATION N/A 07/11/2013   Procedure: FULGURATION ANAL WART WITH EXAM UNDER ANESTHESIA;  Surgeon: Emelia Loron, MD;  Location: [facility];  Service: General;  Laterality: N/A;    OB History   No obstetric history on file.      Home Medications    Prior to Admission medications   Medication Sig Start Date End Date Taking? Authorizing Provider  benzonatate (TESSALON) 100 MG capsule Take 1 capsule (100 mg total) by mouth every 8 (eight) hours. 07/03/20   Hall-Potvin, [place], PA-C    predniSONE (DELTASONE) 50 MG tablet Take 1 tablet (50 mg total) by mouth daily with breakfast. 07/03/20   Hall-Potvin, [place], PA-C    Family History History reviewed. No pertinent family history.  Social History Social History   Tobacco Use  . Smoking status: Current Every Day Smoker    Packs/day: 1.50  . Smokeless tobacco: Never Used  Substance Use Topics  . Alcohol use: Yes  . Drug use: No     Allergies   Patient has no known allergies.   Review of Systems Review of Systems  Pertinent negatives listed in HPI  Physical Exam Triage Vital Signs ED Triage Vitals  Enc Vitals Group     BP 07/09/20 0843 139/70     Pulse Rate 07/09/20 0843 68     Resp 07/09/20 0843 17     Temp 07/09/20 0843 98 F (36.7 C)     Temp Source 07/09/20 0843 Oral     SpO2 07/09/20 0843 94 %     Weight --      Height --      Head Circumference --      Peak Flow --      Pain Score 07/09/20 0841 4     Pain Loc --      Pain Edu? --      Excl. in GC? --    No data  found.  Updated Vital Signs BP 139/70 (BP Location: Right Arm)   Pulse 68   Temp 98 F (36.7 C) (Oral)   Resp 17   LMP 06/22/2013   SpO2 94%   Visual Acuity Right Eye Distance:   Left Eye Distance:   Bilateral Distance:    Right Eye Near:   Left Eye Near:    Bilateral Near:     Physical Exam General appearance: alert, well developed, well nourished, cooperative and in no distress Head: Normocephalic, without obvious abnormality, atraumatic Respiratory: Respirations even and unlabored, normal respiratory rate, persistent dry cough noted during exam  Heart: rate and rhythm normal. No gallop or murmurs noted on exam  Extremities: No gross deformities Skin: Skin color, texture, turgor normal. No rashes seen  Psych: Appropriate mood and affect. Neurologic: Mental status: Alert, oriented to person, place, and time, thought content appropriate.   UC Treatments / Results  Labs (all labs ordered are listed, but only  abnormal results are displayed) Labs Reviewed - No data to display  EKG   Radiology No results found.  Procedures Procedures (including critical care time)  Medications Ordered in UC Medications - No data to display  Initial Impression / Assessment and Plan / UC Course  I have reviewed the triage vital signs and the nursing notes.  Pertinent labs & imaging results that were available during my care of the patient were reviewed by me and considered in my medical decision making (see chart for details).    Acute bronchitis lung exam is reassuring.  Patient still troubled by cough which is interfering with nighttime rest.  PMP aware checked patient is on chronic long-acting opiates however did agree to do promethazine with codeine 5 mL at bedtime with a small quantity of 50 mL.  Provided an albuterol inhaler to be administered 2 puffs every 4-6 hours as needed for shortness of breath and or cyclic coughing.  Patient advised not to take chronic pain medication along with promethazine with codeine.  Advised to follow-up with primary care if symptoms worsen or do not improve.  Final Clinical Impressions(s) / UC Diagnoses   Final diagnoses:  Persistent cough  Acute bronchitis, unspecified organism     Discharge Instructions     I have prescribed you a short course of promethazine with codeine however do not take medication with your currently prescribed pain medication and she can have severe sedative effec  Promethazine with codeine should only be taken at nighttime as prescribed do not take extra doses.  Her daytime management of cough benzonatate 200 mg 3 times daily along with use of your albuterol inhaler 2 puffs every 4-6 hours as needed will help improve symptoms associated with acute on chronic bronchitis.  If symptoms worsen follow-up with your primary care provider for additional work-up and evaluation.    ED Prescriptions    Medication Sig Dispense Auth. Provider    promethazine-codeine (PHENERGAN WITH CODEINE) 6.25-10 MG/5ML syrup Take 5 mLs by mouth at bedtime. 50 mL Bing Neighbors, FNP   benzonatate (TESSALON) 100 MG capsule Take 2 capsules (200 mg total) by mouth 3 (three) times daily as needed for cough. 40 capsule Bing Neighbors, FNP     PDMP not reviewed this encounter.   Bing Neighbors, FNP 07/09/20 1056

## 2020-07-09 NOTE — Discharge Instructions (Addendum)
I have prescribed you a short course of promethazine with codeine however do not take medication with your currently prescribed pain medication and she can have severe sedative effec  Promethazine with codeine should only be taken at nighttime as prescribed do not take extra doses.  Her daytime management of cough benzonatate 200 mg 3 times daily along with use of your albuterol inhaler 2 puffs every 4-6 hours as needed will help improve symptoms associated with acute on chronic bronchitis.  If symptoms worsen follow-up with your primary care provider for additional work-up and evaluation.

## 2020-07-09 NOTE — ED Triage Notes (Signed)
Pt presents for follow up after having bronchitis over a week ago; pt states she is not getting much relief with prescribed medication, still has constant non productive cough and chest tightness.  Pt had negative covid test

## 2020-08-18 ENCOUNTER — Other Ambulatory Visit: Payer: Self-pay | Admitting: Obstetrics and Gynecology

## 2020-08-18 DIAGNOSIS — Z1231 Encounter for screening mammogram for malignant neoplasm of breast: Secondary | ICD-10-CM

## 2020-09-22 ENCOUNTER — Other Ambulatory Visit: Payer: Self-pay

## 2020-09-22 ENCOUNTER — Ambulatory Visit
Admission: RE | Admit: 2020-09-22 | Discharge: 2020-09-22 | Disposition: A | Discharge status: Home / Self Care | Payer: BC Managed Care – PPO | Source: Ambulatory Visit | Attending: Obstetrics and Gynecology | Admitting: Obstetrics and Gynecology

## 2020-09-22 DIAGNOSIS — Z1231 Encounter for screening mammogram for malignant neoplasm of breast: Secondary | ICD-10-CM

## 2020-10-28 ENCOUNTER — Telehealth: Payer: Self-pay | Admitting: Orthopaedic Surgery

## 2020-11-04 ENCOUNTER — Ambulatory Visit: Payer: Self-pay

## 2020-11-04 ENCOUNTER — Other Ambulatory Visit: Payer: Self-pay

## 2020-11-04 ENCOUNTER — Ambulatory Visit (INDEPENDENT_AMBULATORY_CARE_PROVIDER_SITE_OTHER): Payer: BC Managed Care – PPO | Admitting: Orthopaedic Surgery

## 2020-11-04 VITALS — Ht 62.0 in | Wt 169.0 lb

## 2020-11-04 DIAGNOSIS — M25551 Pain in right hip: Secondary | ICD-10-CM | POA: Diagnosis not present

## 2020-11-04 DIAGNOSIS — G8929 Other chronic pain: Secondary | ICD-10-CM

## 2020-11-04 DIAGNOSIS — M25512 Pain in left shoulder: Secondary | ICD-10-CM | POA: Diagnosis not present

## 2020-11-04 DIAGNOSIS — M25511 Pain in right shoulder: Secondary | ICD-10-CM | POA: Diagnosis not present

## 2020-11-04 MED ORDER — METHYLPREDNISOLONE 4 MG PO TABS: ORAL_TABLET | ORAL | 0 refills | Status: DC

## 2020-11-04 MED ORDER — NABUMETONE 750 MG PO TABS: 750.0000 mg | ORAL_TABLET | Freq: Two times a day (BID) | ORAL | 2 refills | Status: DC | PRN

## 2020-11-04 NOTE — Progress Notes (Signed)
Office Visit Note   Patient: Savannah Sparks           Date of Birth: July 14, 1966           MRN: [id number] Visit Date: 11/04/2020              Requested by: Levi Aland, MD [address] [place],  [place] [id number] PCP: Levi Aland, MD   Assessment & Plan: Visit Diagnoses:  1. Pain in right hip   2. Chronic left shoulder pain   3. Chronic right shoulder pain     Plan: She does seem to have severe trochanteric bursitis.  I have recommended outpatient physical therapy as well as a topical Voltaren gel combined with Relafen and a steroid taper.  I will see her back in 6 weeks for reevaluation of right hip after that treatment plan.  She will see Dr. August Saucer at some point as a second opinion for her shoulders.  All questions and concerns were answered and addressed.  Follow-Up Instructions: Return in about 6 weeks (around 12/16/2020).   Orders:  Orders Placed This Encounter  Procedures  . XR HIP UNILAT W OR W/O PELVIS 1V RIGHT  . XR Shoulder Left  . XR Shoulder Right   Meds ordered this encounter  Medications  . methylPREDNISolone (MEDROL) 4 MG tablet    Sig: Medrol dose pack. Take as instructed    Dispense:  21 tablet    Refill:  0  . nabumetone (RELAFEN) 750 MG tablet    Sig: Take 1 tablet (750 mg total) by mouth 2 (two) times daily as needed.    Dispense:  60 tablet    Refill:  2      Procedures: No procedures performed   Clinical Data: No additional findings.   Subjective: Chief Complaint  Patient presents with  . Right Hip - Pain  . Left Shoulder - Pain  . Right Shoulder - Pain  The patient comes today mainly to be seen for her right hip.  She does have some chronic shoulder issues and is seeing Dr. Rennis Chris at [facility].  She came here for second opinion for her shoulders but I will see if we can have her have a follow-up appoint with Dr. August Saucer about her shoulders.  Her main issue to be seen by me today is her right hip.  I  have actually operated on her husband for his hip.  She points to the lateral aspect of her hip as a source of her pain.  She says it clicks.  She has had a one-time steroid injection before and she said is very painful so she is quite needle phobic due to that.  She is never injured the hip or had surgery on the right hip.  This is only been going on recently for about 2 months it has been hurting at night for several years special laying on that side.  HPI  Review of Systems She currently denies any headache, chest pain, shortness of breath, fever, chills, nausea, vomiting  Objective: Vital Signs: Ht 5\' 2"  (1.575 m)   Wt 169 lb (76.7 kg)   LMP 06/22/2013   BMI 30.91 kg/m   Physical Exam She is alert and orient x3 and in no acute distress Ortho Exam Examination of her right hip shows fluid and full range of motion of the hip with no pain in the groin.  She is very sensitive to the extremes of motion on  the lateral aspect of her hip and palpation over the proximal trochanteric area and IT band area.  There is no skin changes.  Her leg lengths are equal.  Her knee exam is normal. Specialty Comments:  No specialty comments available.  Imaging: XR HIP UNILAT W OR W/O PELVIS 1V RIGHT  Result Date: 11/04/2020 An AP pelvis lateral right hip shows no acute findings.  The hip joint is concentric and well located on both hips.  The space is well-maintained.  There is no cortical irregularities around the trochanteric area of either hip or in the pelvis itself.    PMFS History: Patient Active Problem List   Diagnosis Date Noted  . Right lateral epicondylitis 06/20/2017  . Tobacco use disorder 08/11/2013   Past Medical History:  Diagnosis Date  . Chronic pain    neck-back  . Wears glasses     No family history on file.  Past Surgical History:  Procedure Laterality Date  . BREAST BIOPSY Left 07/11/2013   Procedure: BREAST BIOPSY WITH NEEDLE LOCALIZATION;  Surgeon: Emelia Loron,  MD;  Location: [facility];  Service: General;  Laterality: Left;  . BREAST EXCISIONAL BIOPSY Left   . BREAST SURGERY     lt br bx-neg  . CERVICAL FUSION  2009   c4-5  . DILATION AND CURETTAGE OF UTERUS  2005  . MINOR FULGERATION OF ANAL CONDYLOMA  2004  . WART FULGURATION N/A 07/11/2013   Procedure: FULGURATION ANAL WART WITH EXAM UNDER ANESTHESIA;  Surgeon: Emelia Loron, MD;  Location: [facility];  Service: General;  Laterality: N/A;   Social History   Occupational History  . Not on file  Tobacco Use  . Smoking status: Current Every Day Smoker    Packs/day: 1.50  . Smokeless tobacco: Never Used  Substance and Sexual Activity  . Alcohol use: Yes  . Drug use: No  . Sexual activity: Not on file

## 2020-11-05 ENCOUNTER — Telehealth: Payer: Self-pay | Admitting: *Deleted

## 2020-11-05 NOTE — Telephone Encounter (Signed)
Called and left the patient a message to a call the office back. Patient needs to be scheduled for a new patient appt on Monday with Dr Andrey Farmer

## 2020-11-10 ENCOUNTER — Telehealth: Payer: Self-pay | Admitting: *Deleted

## 2020-11-10 NOTE — Telephone Encounter (Signed)
Spoke with the patient and scheduled a new patient appt for Dr Andrey Farmer on 4/11 at 10:30 am. Patient given the address and phone number for the clinic. Patient also given the policy for mask and visitors. Patient stated that she needed an appt on either 4/11, 4/21 or 4/25

## 2020-11-13 ENCOUNTER — Telehealth: Payer: Self-pay | Admitting: *Deleted

## 2020-11-13 NOTE — Telephone Encounter (Signed)
PC to patient regarding Monday's upcoming appointment & meaningful use questions.  No answer, left message for patient to call GYN office.

## 2020-11-16 ENCOUNTER — Other Ambulatory Visit: Payer: Self-pay

## 2020-11-16 ENCOUNTER — Inpatient Hospital Stay: Payer: BC Managed Care – PPO | Attending: Gynecologic Oncology | Admitting: Gynecologic Oncology

## 2020-11-16 ENCOUNTER — Encounter: Payer: Self-pay | Admitting: Gynecologic Oncology

## 2020-11-16 VITALS — BP 136/86 | HR 65 | Temp 97.6°F | Resp 17 | Ht 62.0 in | Wt 168.8 lb

## 2020-11-16 DIAGNOSIS — D071 Carcinoma in situ of vulva: Secondary | ICD-10-CM | POA: Insufficient documentation

## 2020-11-16 DIAGNOSIS — A630 Anogenital (venereal) warts: Secondary | ICD-10-CM | POA: Diagnosis not present

## 2020-11-16 DIAGNOSIS — F17210 Nicotine dependence, cigarettes, uncomplicated: Secondary | ICD-10-CM | POA: Insufficient documentation

## 2020-11-16 DIAGNOSIS — G8929 Other chronic pain: Secondary | ICD-10-CM | POA: Insufficient documentation

## 2020-11-16 DIAGNOSIS — Z1211 Encounter for screening for malignant neoplasm of colon: Secondary | ICD-10-CM

## 2020-11-16 DIAGNOSIS — K6282 Dysplasia of anus: Secondary | ICD-10-CM | POA: Insufficient documentation

## 2020-11-16 DIAGNOSIS — Z720 Tobacco use: Secondary | ICD-10-CM

## 2020-11-16 NOTE — Progress Notes (Signed)
Consult Note: Gyn-Onc  Consult was requested by Dr. Dareen Piano for the evaluation of Savannah Sparks 55 y.o. female  CC:  Chief Complaint  Patient presents with  . Condyloma  . history of vulvar and perianal dysplasia    Assessment/Plan:  [name]  is a 55 y.o.  year old with a history of AIN and VIN.  On my evaluation today she has no evidence of disease. Therefore does not require interventions at this time. I explained that while I perform interventions for vulvar dysplasia, I do not perform routine gynecologic surveillance, and therefore she will need to follow-up for vulvar inspections with her gynecologist's office. Should they identify and biopsy a lesion in the future (on the vulva) that requires intervention, I'd be happy to see her for intervention.  I explained that I do not treat anal disease, and she should continue follow-up with her colorectal surgery colleagues as scheduled.   I counseled the patient regarding the association between lower genital tract dysplasia and tobacco use.  I explained that this was her 1 modifiable risk factor.  I provide the patient with information and contact information for [facility] tobacco cessation programs.  She is interested in colonoscopy as this had been recommended in the past by her colorectal surgeon.  We have provided her with referral for colon cancer screening.  I will see her back in the future should she develop the new onset of vulvar dysplasia.   HPI: [name] is a 55 year old parous woman who was seen in consultation at the request of Dr Dareen Piano for evaluation of history of AIN and VIN.  The patient reported having a longstanding history of vulvar and perianal dysplasia.  She denied history of cervical dysplasia.  Her vulvar dysplasia had never been treated with excisions but had undergone laser or freezing procedures and interferon with Dr. Dareen Piano in the past.  She reported that her last  treatment for vulvar dysplasia was 8 months ago.  She also reported a history of AIN for which she was treated by Dr. Marin Olp and more recently by Dr. Mirian Mo, colorectal surgeon at [facility].  This was most recently treated in February 2022.  Dr. Dareen Piano was planning retirement and desired evaluation for Savannah Sparks by gynecologic oncology.  Her medical history is most significant for no history of HIV, immunodeficiency either acquired or iatrogenic.  Her surgical history is most significant for perianal surgery for AIN.  Her gynecologic history is remarkable for persistent vulvar and perianal dysplasia.  She smokes 2 packs/day of cigarettes and has so for the majority of her adult life.  She has attempted to quit on multiple occasions with most success using patches.  Current Meds:  Outpatient Encounter Medications as of 11/16/2020  Medication Sig  . ibuprofen (ADVIL) 800 MG tablet Take 800 mg by mouth every 8 (eight) hours as needed for moderate pain.  . [DISCONTINUED] oxymorphone (OPANA) 5 MG tablet Take 5 mg by mouth 3 (three) times daily as needed for pain.  . [DISCONTINUED] Oxymorphone HCl, Crush Resist, 5 MG PO T12A Opana ER 10 mg tablet, crush resistant, extended release  . oxymorphone (OPANA) 10 MG tablet Take 10 mg by mouth every 8 (eight) hours.  . [DISCONTINUED] benzonatate (TESSALON) 100 MG capsule Take 2 capsules (200 mg total) by mouth 3 (three) times daily as needed for cough.  . [DISCONTINUED] methylPREDNISolone (MEDROL) 4 MG tablet Medrol dose pack. Take as instructed  . [DISCONTINUED]  nabumetone (RELAFEN) 750 MG tablet Take 1 tablet (750 mg total) by mouth 2 (two) times daily as needed.  . [DISCONTINUED] predniSONE (DELTASONE) 50 MG tablet Take 1 tablet (50 mg total) by mouth daily with breakfast.  . [DISCONTINUED] promethazine-codeine (PHENERGAN WITH CODEINE) 6.25-10 MG/5ML syrup Take 5 mLs by mouth at bedtime.   No facility-administered encounter  medications on file as of 11/16/2020.    Allergy:  Allergies  Allergen Reactions  . No Known Allergies     Social Hx:   Social History   Socioeconomic History  . Marital status: Married    Spouse name: Not on file  . Number of children: Not on file  . Years of education: Not on file  . Highest education level: Not on file  Occupational History  . Not on file  Tobacco Use  . Smoking status: Current Every Day Smoker    Packs/day: 1.50    Types: Cigarettes  . Smokeless tobacco: Never Used  Substance and Sexual Activity  . Alcohol use: Yes  . Drug use: No  . Sexual activity: Not on file  Other Topics Concern  . Not on file  Social History Narrative  . Not on file   Social Determinants of Health   Financial Resource Strain: Not on file  Food Insecurity: Not on file  Transportation Needs: Not on file  Physical Activity: Not on file  Stress: Not on file  Social Connections: Not on file  Intimate Partner Violence: Not on file    Past Surgical Hx:  Past Surgical History:  Procedure Laterality Date  . BREAST BIOPSY Left 07/11/2013   Procedure: BREAST BIOPSY WITH NEEDLE LOCALIZATION;  Surgeon: Emelia Loron, MD;  Location: [facility];  Service: General;  Laterality: Left;  . BREAST EXCISIONAL BIOPSY Left   . BREAST SURGERY     lt br bx-neg  . CERVICAL FUSION  2009   c4-5  . DILATION AND CURETTAGE OF UTERUS  2005  . MINOR FULGERATION OF ANAL CONDYLOMA  2004  . WART FULGURATION N/A 07/11/2013   Procedure: FULGURATION ANAL WART WITH EXAM UNDER ANESTHESIA;  Surgeon: Emelia Loron, MD;  Location: [facility];  Service: General;  Laterality: N/A;    Past Medical Hx:  Past Medical History:  Diagnosis Date  . Chronic pain    neck-back  . Wears glasses     Past Gynecological History:  See HPI, persistent vulvar dysplasia. Pap normal in 2021. Patient's last menstrual period was 06/22/2013.  Family Hx: History reviewed. No pertinent  family history.  Review of Systems:  Constitutional  Feels well,   ENT Normal appearing ears and nares bilaterally Skin/Breast  No rash, sores, jaundice, itching, dryness Cardiovascular  No chest pain, shortness of breath, or edema  Pulmonary  No cough or wheeze.  Gastro Intestinal  No nausea, vomitting, or diarrhoea. No bright red blood per rectum, no abdominal pain, change in bowel movement, or constipation.  Genito Urinary  No frequency, urgency, dysuria, no abnormal bleeding Musculo Skeletal  No myalgia, arthralgia, joint swelling or pain  Neurologic  No weakness, numbness, change in gait,  Psychology  No depression, anxiety, insomnia.   Vitals:  Blood pressure 136/86, pulse 65, temperature 97.6 F (36.4 C), temperature source Tympanic, resp. rate 17, height 5\' 2"  (1.575 m), weight 168 lb 12.8 oz (76.6 kg), last menstrual period 06/22/2013, SpO2 98 %.  Physical Exam: WD in NAD Neck  Supple NROM, without any enlargements.  Lymph Node Survey No cervical  supraclavicular or inguinal adenopathy Cardiovascular  Well perfused peripheries Lungs  No increased WOB Skin  No rash/lesions/breakdown  Psychiatry  Alert and oriented to person, place, and time  Abdomen  Normoactive bowel sounds, abdomen soft, non-tender and obese without evidence of hernia.  Back No CVA tenderness Genito Urinary  Vulva/vagina: Normal external female genitalia.  No lesions. No discharge or bleeding.  Acetic acid applied. No vulvar lesions seen.  Rectal  deferred Extremities  No bilateral cyanosis, clubbing or edema.  60 minutes of total time was spent for this patient encounter, including preparation, face-to-face counseling with the patient and coordination of care, review of imaging (results and images), communication with the referring provider and documentation of the encounter.   Luisa Dago, MD  11/16/2020, 11:15 AM

## 2020-11-16 NOTE — Patient Instructions (Signed)
There is no evidence for vulvar dysplasia at this time. Therefore treatment with Dr Andrey Farmer is not necessary. Preventative treatment for future vulvar cancer would be smoking cessation. Dr Andrey Farmer has provided you with information regarding how to access [facility]'s smoking cessation resources.  You should continue to follow-up with your doctors at [facility] for monitoring and treatment of the anal dysplasia.  You should return to see your OBGYN for monitoring of the vulva. Next check should be in approximately 6 months. If there is no evidence of dysplasia at that time, these visits can be spaced to yearly. If you have biopsy proven dysplasia at that time, Dr Andrey Farmer would be happy to see you to discuss treatments. Dr Andrey Farmer does not treat peri-anal dysplasia.

## 2020-11-30 ENCOUNTER — Encounter: Payer: Self-pay | Admitting: Gastroenterology

## 2020-12-30 ENCOUNTER — Encounter: Payer: Self-pay | Admitting: Orthopaedic Surgery

## 2020-12-30 ENCOUNTER — Ambulatory Visit: Payer: Self-pay

## 2020-12-30 ENCOUNTER — Other Ambulatory Visit: Payer: Self-pay

## 2020-12-30 ENCOUNTER — Ambulatory Visit (INDEPENDENT_AMBULATORY_CARE_PROVIDER_SITE_OTHER): Payer: BC Managed Care – PPO | Admitting: Orthopaedic Surgery

## 2020-12-30 DIAGNOSIS — M25561 Pain in right knee: Secondary | ICD-10-CM | POA: Diagnosis not present

## 2020-12-30 DIAGNOSIS — M7061 Trochanteric bursitis, right hip: Secondary | ICD-10-CM

## 2020-12-30 DIAGNOSIS — G8929 Other chronic pain: Secondary | ICD-10-CM

## 2020-12-30 MED ORDER — METHYLPREDNISOLONE ACETATE 40 MG/ML IJ SUSP
40.0000 mg | INTRAMUSCULAR | Status: AC | PRN
  Administered 2020-12-30: 40 mg via INTRA_ARTICULAR

## 2020-12-30 MED ORDER — LIDOCAINE HCL 1 % IJ SOLN
3.0000 mL | INTRAMUSCULAR | Status: AC | PRN
  Administered 2020-12-30: 3 mL

## 2020-12-30 NOTE — Progress Notes (Signed)
Office Visit Note   Patient: Savannah Sparks           Date of Birth: 1966-02-18           MRN: [id number] Visit Date: 12/30/2020              Requested by: Levi Aland, MD [address] [place],  [place] [address] PCP: Levi Aland, MD   Assessment & Plan: Visit Diagnoses:  1. Chronic pain of right knee   2. Trochanteric bursitis, right hip     Plan:  Recommend quad strengthening exercises as shown.  Also IT band stretching exercises as shown.  She is continues to have pain we will see her back in 2 weeks otherwise she can follow-up as needed.  Questions were encouraged and answered at length today.  Follow-Up Instructions: Return in about 2 weeks (around 01/13/2021).   Orders:  Orders Placed This Encounter  Procedures  . Large Joint Inj  . Large Joint Inj  . XR Knee 1-2 Views Right   No orders of the defined types were placed in this encounter.     Procedures: Large Joint Inj: R knee on 12/30/2020 10:24 AM Indications: pain Details: 22 G 1.5 in needle, anterolateral approach  Arthrogram: No  Medications: 3 mL lidocaine 1 %; 40 mg methylPREDNISolone acetate 40 MG/ML Outcome: tolerated well, no immediate complications Procedure, treatment alternatives, risks and benefits explained, specific risks discussed. Consent was given by the patient. Immediately prior to procedure a time out was called to verify the correct patient, procedure, equipment, support staff and site/side marked as required. Patient was prepped and draped in the usual sterile fashion.   Large Joint Inj: R greater trochanter on 12/30/2020 10:25 AM Indications: pain Details: 22 G 1.5 in needle, lateral approach  Arthrogram: No  Medications: 3 mL lidocaine 1 %; 40 mg methylPREDNISolone acetate 40 MG/ML Outcome: tolerated well, no immediate complications Procedure, treatment alternatives, risks and benefits explained, specific risks discussed. Consent was given by the  patient. Immediately prior to procedure a time out was called to verify the correct patient, procedure, equipment, support staff and site/side marked as required. Patient was prepped and draped in the usual sterile fashion.       Clinical Data: No additional findings.   Subjective: Chief Complaint  Patient presents with  . Right Hip - Pain    HPI Patient comes in today for right hip pain.  She wants an injection lateral aspect the right hip for trochanteric bursitis.  She was last seen by Dr. Magnus Ivan on 11/04/2020 and was given prednisone Dosepak, Relafen and was to go to physical therapy.  She states the medications did not help.  She unfortunately did not get a physical therapy.  She states no stretching for trochanteric bursitis.  She denies any radicular symptoms down the leg.  However she has been having right knee pain for the past 6 months no known injury.  Pains medial aspect of the knee.  Knee gives way at times and walks.  She notes no swelling in the knee.  She had no treatment for the knee pain.  Review of Systems Denies any fevers chills.  Please see HPI otherwise negative or noncontributory.  Objective: Vital Signs: LMP 06/22/2013   Physical Exam Constitutional:      Appearance: She is not ill-appearing or diaphoretic.  Pulmonary:     Effort: Pulmonary effort is normal.  Neurological:     Mental Status: She is  alert and oriented to person, place, and time.  Psychiatric:        Mood and Affect: Mood normal.     Ortho Exam Bilateral hips good range of motion of both hips.  She has tenderness over the trochanteric region both hips. Bilateral knees she has tenderness over the medial joint line of the right knee only.  No abnormal warmth erythema or effusion of either knee.  Good range of motion both knees.  Right knee patellofemoral crepitus with passive range of motion.  Anterior drawer is negative bilaterally.  Valgus varus stressing reveals no laxity of either  knee.  McMurray's positive on the right.  Specialty Comments:  No specialty comments available.  Imaging: XR Knee 1-2 Views Right  Result Date: 12/30/2020 AP lateral views right knee: Medial lateral joint lines well-maintained.  Mild patellofemoral changes.  No acute fractures bony abnormalities otherwise.    PMFS History: Patient Active Problem List   Diagnosis Date Noted  . 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 AIN (anal intraepithelial neoplasia) anal canal 11/16/2020  . Right lateral epicondylitis 06/20/2017  . Tobacco abuse 08/11/2013   Past Medical History:  Diagnosis Date  . Chronic pain    neck-back  . Wears glasses     History reviewed. No pertinent family history.  Past Surgical History:  Procedure Laterality Date  . BREAST BIOPSY Left 07/11/2013   Procedure: BREAST BIOPSY WITH NEEDLE LOCALIZATION;  Surgeon: Emelia Loron, MD;  Location: [facility];  Service: General;  Laterality: Left;  . BREAST EXCISIONAL BIOPSY Left   . BREAST SURGERY     lt br bx-neg  . CERVICAL FUSION  2009   c4-5  . DILATION AND CURETTAGE OF UTERUS  2005  . MINOR FULGERATION OF ANAL CONDYLOMA  2004  . WART FULGURATION N/A 07/11/2013   Procedure: FULGURATION ANAL WART WITH EXAM UNDER ANESTHESIA;  Surgeon: Emelia Loron, MD;  Location: [facility];  Service: General;  Laterality: N/A;   Social History   Occupational History  . Not on file  Tobacco Use  . Smoking status: Current Every Day Smoker    Packs/day: 1.50    Types: Cigarettes  . Smokeless tobacco: Never Used  Substance and Sexual Activity  . Alcohol use: Yes  . Drug use: No  . Sexual activity: Not on file

## 2021-01-04 ENCOUNTER — Encounter: Payer: Self-pay | Admitting: Emergency Medicine

## 2021-01-04 ENCOUNTER — Ambulatory Visit
Admission: EM | Admit: 2021-01-04 | Discharge: 2021-01-04 | Disposition: A | Discharge status: Home / Self Care | Payer: BC Managed Care – PPO | Attending: Physician Assistant | Admitting: Physician Assistant

## 2021-01-04 ENCOUNTER — Other Ambulatory Visit: Payer: Self-pay

## 2021-01-04 DIAGNOSIS — L237 Allergic contact dermatitis due to plants, except food: Secondary | ICD-10-CM | POA: Diagnosis not present

## 2021-01-04 MED ORDER — PREDNISONE 10 MG (21) PO TBPK: ORAL_TABLET | Freq: Every day | ORAL | 0 refills | Status: AC

## 2021-01-04 MED ORDER — METHYLPREDNISOLONE SODIUM SUCC 125 MG IJ SOLR
80.0000 mg | Freq: Once | INTRAMUSCULAR | Status: AC
  Administered 2021-01-04: 80 mg via INTRAMUSCULAR

## 2021-01-04 NOTE — ED Provider Notes (Signed)
[name]    CSN: [id number] Arrival date & time: 01/04/21  0907      History   Chief Complaint Chief Complaint  Patient presents with  . Rash    HPI Savannah Sparks is a 55 y.o. female.   Pt complains of itching poison ivy rash to the right lower leg that she noticed yesterday.  She reports getting poison ivy rash several times over the last few years with improvement with steroid shot in clinic.  She denies surrounding redness, swelling, discharge.  She has applied calamine lotion with minimal relief.  Sister with similar rash.      Past Medical History:  Diagnosis Date  . Chronic pain    neck-back  . Wears glasses     Patient Active Problem List   Diagnosis Date Noted  . 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 AIN (anal intraepithelial neoplasia) anal canal 11/16/2020  . Right lateral epicondylitis 06/20/2017  . Tobacco abuse 08/11/2013    Past Surgical History:  Procedure Laterality Date  . BREAST BIOPSY Left 07/11/2013   Procedure: BREAST BIOPSY WITH NEEDLE LOCALIZATION;  Surgeon: Emelia Loron, MD;  Location: [facility];  Service: General;  Laterality: Left;  . BREAST EXCISIONAL BIOPSY Left   . BREAST SURGERY     lt br bx-neg  . CERVICAL FUSION  2009   c4-5  . DILATION AND CURETTAGE OF UTERUS  2005  . MINOR FULGERATION OF ANAL CONDYLOMA  2004  . WART FULGURATION N/A 07/11/2013   Procedure: FULGURATION ANAL WART WITH EXAM UNDER ANESTHESIA;  Surgeon: Emelia Loron, MD;  Location: [facility];  Service: General;  Laterality: N/A;    OB History   No obstetric history on file.      Home Medications    Prior to Admission medications   Medication Sig Start Date End Date Taking? Authorizing Provider  predniSONE (STERAPRED UNI-PAK 21 TAB) 10 MG (21) TBPK tablet Take by mouth daily. Take 6 tabs by mouth daily  for 2 days, then 5 tabs for 2 days, then 4 tabs for 2 days, then 3 tabs  for 2 days, 2 tabs for 2 days, then 1 tab by mouth daily for 2 days 01/04/21  Yes Anaeli Cornwall, PA-C  ibuprofen (ADVIL) 800 MG tablet Take 800 mg by mouth every 8 (eight) hours as needed for moderate pain. Patient not taking: Reported on 01/04/2021    [provider]  oxymorphone (OPANA) 10 MG tablet Take 10 mg by mouth every 8 (eight) hours. Patient not taking: Reported on 01/04/2021 10/26/20   [provider]    Family History History reviewed. No pertinent family history.  Social History Social History   Tobacco Use  . Smoking status: Current Every Day Smoker    Packs/day: 1.50    Types: Cigarettes  . Smokeless tobacco: Never Used  Vaping Use  . Vaping Use: Never used  Substance Use Topics  . Alcohol use: Yes  . Drug use: No     Allergies   No known allergies   Review of Systems Review of Systems  Constitutional: Negative for chills and fever.  HENT: Negative for ear pain and sore throat.   Eyes: Negative for pain and visual disturbance.  Respiratory: Negative for cough and shortness of breath.   Cardiovascular: Negative for chest pain and palpitations.  Gastrointestinal: Negative for abdominal pain and vomiting.  Genitourinary: Negative for dysuria and hematuria.  Musculoskeletal: Negative for arthralgias and back  pain.  Skin: Positive for rash. Negative for color change.  Neurological: Negative for seizures and syncope.  All other systems reviewed and are negative.    Physical Exam Triage Vital Signs ED Triage Vitals  Enc Vitals Group     BP 01/04/21 0915 134/82     Pulse Rate 01/04/21 0915 79     Resp 01/04/21 0915 18     Temp 01/04/21 0915 98.1 F (36.7 C)     Temp Source 01/04/21 0915 Oral     SpO2 01/04/21 0915 95 %     Weight --      Height --      Head Circumference --      Peak Flow --      Pain Score 01/04/21 0925 0     Pain Loc --      Pain Edu? --      Excl. in GC? --    No data found.  Updated Vital Signs BP 134/82  (BP Location: Left Arm)   Pulse 79   Temp 98.1 F (36.7 C) (Oral)   Resp 18   LMP 06/22/2013   SpO2 95%   Visual Acuity Right Eye Distance:   Left Eye Distance:   Bilateral Distance:    Right Eye Near:   Left Eye Near:    Bilateral Near:     Physical Exam Vitals and nursing note reviewed.  Constitutional:      General: She is not in acute distress.    Appearance: She is well-developed.  HENT:     Head: Normocephalic and atraumatic.  Eyes:     Conjunctiva/sclera: Conjunctivae normal.  Cardiovascular:     Rate and Rhythm: Normal rate and regular rhythm.     Heart sounds: No murmur heard.   Pulmonary:     Effort: Pulmonary effort is normal. No respiratory distress.     Breath sounds: Normal breath sounds.  Abdominal:     Palpations: Abdomen is soft.     Tenderness: There is no abdominal tenderness.  Musculoskeletal:     Cervical back: Neck supple.  Skin:    General: Skin is warm and dry.       Neurological:     Mental Status: She is alert.      UC Treatments / Results  Labs (all labs ordered are listed, but only abnormal results are displayed) Labs Reviewed - No data to display  EKG   Radiology No results found.  Procedures Procedures (including critical care time)  Medications Ordered in UC Medications  methylPREDNISolone sodium succinate (SOLU-MEDROL) 125 mg/2 mL injection 80 mg (has no administration in time range)    Initial Impression / Assessment and Plan / UC Course  I have reviewed the triage vital signs and the nursing notes.  Pertinent labs & imaging results that were available during my care of the patient were reviewed by me and considered in my medical decision making (see chart for details).     Poison ivy rash.  Pt given solumedrol in clinic.  Discussed risk of rebound rash.  dosepak sent to pharmacy.  She reports her insurance runs out tomorrow, also requests to have dosepak on hand in case current rash gets worse or she develops  new rash.  Return precautions discussed.  Final Clinical Impressions(s) / UC Diagnoses   Final diagnoses:  Poison ivy     Discharge Instructions     Can start prednisone if needed Apply wet compress, calamine lotion for relief.  ED Prescriptions    Medication Sig Dispense Auth. Provider   predniSONE (STERAPRED UNI-PAK 21 TAB) 10 MG (21) TBPK tablet Take by mouth daily. Take 6 tabs by mouth daily  for 2 days, then 5 tabs for 2 days, then 4 tabs for 2 days, then 3 tabs for 2 days, 2 tabs for 2 days, then 1 tab by mouth daily for 2 days 42 tablet Nichollas Perusse, PA-C     PDMP not reviewed this encounter.   Jodell Cipro, PA-C 01/04/21 [phone]

## 2021-01-04 NOTE — ED Triage Notes (Signed)
Seen by provider prior to this nurse 

## 2021-01-04 NOTE — Discharge Instructions (Addendum)
Can start prednisone if needed Apply wet compress, calamine lotion for relief.

## 2021-01-13 ENCOUNTER — Ambulatory Visit: Payer: BC Managed Care – PPO | Admitting: Orthopaedic Surgery

## 2021-02-19 ENCOUNTER — Encounter: Payer: BC Managed Care – PPO | Admitting: Gastroenterology
